# Patient Record
Sex: Female | Born: 1945 | Hispanic: Yes | Smoking: Never smoker
Health system: Southern US, Community
[De-identification: ages and names within clinical notes are randomized; demographics above are authoritative.]

## PROBLEM LIST (undated history)

## (undated) DIAGNOSIS — E785 Hyperlipidemia, unspecified: Secondary | ICD-10-CM

## (undated) DIAGNOSIS — I10 Essential (primary) hypertension: Secondary | ICD-10-CM

## (undated) DIAGNOSIS — R252 Cramp and spasm: Secondary | ICD-10-CM

## (undated) DIAGNOSIS — M1990 Unspecified osteoarthritis, unspecified site: Secondary | ICD-10-CM

## (undated) DIAGNOSIS — C801 Malignant (primary) neoplasm, unspecified: Secondary | ICD-10-CM

## (undated) HISTORY — PX: ABDOMINAL HYSTERECTOMY: SHX81

## (undated) HISTORY — DX: Cramp and spasm: R25.2

## (undated) HISTORY — DX: Essential (primary) hypertension: I10

## (undated) HISTORY — PX: APPENDECTOMY: SHX54

## (undated) HISTORY — DX: Malignant (primary) neoplasm, unspecified: C80.1

## (undated) HISTORY — DX: Hyperlipidemia, unspecified: E78.5

## (undated) HISTORY — PX: OTHER SURGICAL HISTORY: SHX169

---
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
## 1974-05-30 HISTORY — PX: TUBAL LIGATION: SHX77

## 2011-08-24 DIAGNOSIS — R350 Frequency of micturition: Secondary | ICD-10-CM | POA: Diagnosis not present

## 2011-08-24 DIAGNOSIS — J45909 Unspecified asthma, uncomplicated: Secondary | ICD-10-CM | POA: Diagnosis not present

## 2011-08-24 DIAGNOSIS — N390 Urinary tract infection, site not specified: Secondary | ICD-10-CM | POA: Diagnosis not present

## 2011-08-24 DIAGNOSIS — R05 Cough: Secondary | ICD-10-CM | POA: Diagnosis not present

## 2011-08-24 DIAGNOSIS — J309 Allergic rhinitis, unspecified: Secondary | ICD-10-CM | POA: Diagnosis not present

## 2011-10-21 DIAGNOSIS — N3941 Urge incontinence: Secondary | ICD-10-CM | POA: Diagnosis not present

## 2011-10-21 DIAGNOSIS — N390 Urinary tract infection, site not specified: Secondary | ICD-10-CM | POA: Diagnosis not present

## 2011-10-21 DIAGNOSIS — M25569 Pain in unspecified knee: Secondary | ICD-10-CM | POA: Diagnosis not present

## 2011-10-28 DIAGNOSIS — M25569 Pain in unspecified knee: Secondary | ICD-10-CM | POA: Diagnosis not present

## 2011-10-29 DIAGNOSIS — M25569 Pain in unspecified knee: Secondary | ICD-10-CM | POA: Diagnosis not present

## 2012-09-09 ENCOUNTER — Encounter (HOSPITAL_BASED_OUTPATIENT_CLINIC_OR_DEPARTMENT_OTHER): Payer: Self-pay | Admitting: *Deleted

## 2012-09-09 ENCOUNTER — Emergency Department (HOSPITAL_BASED_OUTPATIENT_CLINIC_OR_DEPARTMENT_OTHER)
Admission: EM | Admit: 2012-09-09 | Discharge: 2012-09-10 | Disposition: A | Discharge status: Home / Self Care | Payer: Medicare Other | Attending: Emergency Medicine | Admitting: Emergency Medicine

## 2012-09-09 DIAGNOSIS — R269 Unspecified abnormalities of gait and mobility: Secondary | ICD-10-CM | POA: Diagnosis not present

## 2012-09-09 DIAGNOSIS — IMO0002 Reserved for concepts with insufficient information to code with codable children: Secondary | ICD-10-CM | POA: Diagnosis not present

## 2012-09-09 DIAGNOSIS — Z79899 Other long term (current) drug therapy: Secondary | ICD-10-CM | POA: Diagnosis not present

## 2012-09-09 DIAGNOSIS — M25469 Effusion, unspecified knee: Secondary | ICD-10-CM | POA: Diagnosis not present

## 2012-09-09 DIAGNOSIS — M1710 Unilateral primary osteoarthritis, unspecified knee: Secondary | ICD-10-CM | POA: Diagnosis not present

## 2012-09-09 DIAGNOSIS — M1712 Unilateral primary osteoarthritis, left knee: Secondary | ICD-10-CM

## 2012-09-09 HISTORY — DX: Unspecified osteoarthritis, unspecified site: M19.90

## 2012-09-09 MED ORDER — OXYCODONE-ACETAMINOPHEN 5-325 MG PO TABS
1.0000 | ORAL_TABLET | Freq: Once | ORAL | Status: AC
  Administered 2012-09-10: 1 via ORAL
  Filled 2012-09-09 (×2): qty 1

## 2012-09-09 NOTE — ED Notes (Signed)
Family member states that pt was seen at Ortho 3 weeks ago and dx'd with arthritis. Knee was xrayed. Given Meloxicam and for the past few days it has not worked. Pain has increased.

## 2012-09-09 NOTE — ED Notes (Signed)
MD at bedside. 

## 2012-09-10 MED ORDER — OXYCODONE-ACETAMINOPHEN 5-325 MG PO TABS: 1.0000 | ORAL_TABLET | Freq: Four times a day (QID) | ORAL | Status: DC | PRN

## 2012-09-10 NOTE — ED Provider Notes (Signed)
History     CSN: [id number]  Arrival date & time 09/09/12  2136   First MD Initiated Contact with Patient 09/09/12 2352      Chief Complaint  Patient presents with  . Knee Pain    (Consider location/radiation/quality/duration/timing/severity/associated sxs/prior treatment) Patient is a 67 y.o. female presenting with knee pain.  Knee Pain Associated symptoms: no back pain and no fever    patient presents with left knee pain. His been seen by orthopedics for the same. Has been on Mobic. Had x-ray done at their office. Pain had improved but has worsened again. Worse with walking. No trauma. No fevers. Mild swelling. No chest pain or trouble breathing. She reportedly has arthritis in both knees. The orthopedic surgeon reportedly stated that she would inject some steroids if it does not get better.  Past Medical History  Diagnosis Date  . Arthritis     History reviewed. No pertinent past surgical history.  History reviewed. No pertinent family history.  History  Substance Use Topics  . Smoking status: Never Smoker   . Smokeless tobacco: Not on file  . Alcohol Use: No    OB History   Grav Para Term Preterm Abortions TAB SAB Ect Mult Living                  Review of Systems  Constitutional: Negative for fever.  Musculoskeletal: Positive for joint swelling and gait problem. Negative for back pain and arthralgias.  Skin: Negative for rash and wound.  Neurological: Negative for tremors, weakness and numbness.    Allergies  Review of patient's allergies indicates no known allergies.  Home Medications   Current Outpatient Rx  Name  Route  Sig  Dispense  Refill  . acetaminophen (TYLENOL) 500 MG tablet   Oral   Take 500 mg by mouth every 6 (six) hours as needed for pain.         . meloxicam (MOBIC) 15 MG tablet   Oral   Take 15 mg by mouth daily.         Marland Kitchen oxyCODONE-acetaminophen (PERCOCET/ROXICET) 5-325 MG per tablet   Oral   Take 1-2 tablets by mouth every 6  (six) hours as needed for pain.   15 tablet   0     BP 149/65  Pulse 68  Temp(Src) 98.2 F (36.8 C) (Oral)  Resp 20  Ht 5\' 5"  (1.651 m)  Wt 170 lb (77.111 kg)  BMI 28.29 kg/m2  SpO2 100%  Physical Exam  Constitutional: She appears well-developed and well-nourished.  HENT:  Head: Normocephalic.  Eyes: Pupils are equal, round, and reactive to light.  Musculoskeletal: She exhibits tenderness.  Tenderness to left knee, worse superiorly. Mild effusion. Joint is not irritable. Decreased range of motion due to pain. No erythema. Knee is stable. Neurovascular intact distally. Trace edema bilateral lower extremities  Skin: No erythema.    ED Course  Procedures (including critical care time)  Labs Reviewed - No data to display No results found.   1. Osteoarthritis of left knee       MDM  *Patient with left knee pain. Has seen Ortho for same. Has been on meloxicam. We'll add Percocet and will followup with her orthopedics. Does not appear infectious. No new injury and has had previous x-ray. Does not appear to need imaging at this time.doubt DVT        Juliet Rude. Rubin Payor, MD 09/10/12 [phone]

## 2013-01-01 DIAGNOSIS — R002 Palpitations: Secondary | ICD-10-CM | POA: Diagnosis not present

## 2013-07-17 ENCOUNTER — Emergency Department (HOSPITAL_BASED_OUTPATIENT_CLINIC_OR_DEPARTMENT_OTHER)
Admission: EM | Admit: 2013-07-17 | Discharge: 2013-07-17 | Disposition: A | Discharge status: Home / Self Care | Payer: Medicare Other | Attending: Emergency Medicine | Admitting: Emergency Medicine

## 2013-07-17 ENCOUNTER — Emergency Department (HOSPITAL_BASED_OUTPATIENT_CLINIC_OR_DEPARTMENT_OTHER): Payer: Medicare Other

## 2013-07-17 ENCOUNTER — Encounter (HOSPITAL_BASED_OUTPATIENT_CLINIC_OR_DEPARTMENT_OTHER): Payer: Self-pay | Admitting: Emergency Medicine

## 2013-07-17 DIAGNOSIS — IMO0002 Reserved for concepts with insufficient information to code with codable children: Secondary | ICD-10-CM | POA: Insufficient documentation

## 2013-07-17 DIAGNOSIS — S4980XA Other specified injuries of shoulder and upper arm, unspecified arm, initial encounter: Secondary | ICD-10-CM | POA: Diagnosis not present

## 2013-07-17 DIAGNOSIS — Z791 Long term (current) use of non-steroidal anti-inflammatories (NSAID): Secondary | ICD-10-CM | POA: Diagnosis not present

## 2013-07-17 DIAGNOSIS — M545 Low back pain, unspecified: Secondary | ICD-10-CM | POA: Diagnosis not present

## 2013-07-17 DIAGNOSIS — M129 Arthropathy, unspecified: Secondary | ICD-10-CM | POA: Insufficient documentation

## 2013-07-17 DIAGNOSIS — Y939 Activity, unspecified: Secondary | ICD-10-CM | POA: Insufficient documentation

## 2013-07-17 DIAGNOSIS — S298XXA Other specified injuries of thorax, initial encounter: Secondary | ICD-10-CM | POA: Diagnosis not present

## 2013-07-17 DIAGNOSIS — S46909A Unspecified injury of unspecified muscle, fascia and tendon at shoulder and upper arm level, unspecified arm, initial encounter: Secondary | ICD-10-CM | POA: Diagnosis not present

## 2013-07-17 DIAGNOSIS — Y9229 Other specified public building as the place of occurrence of the external cause: Secondary | ICD-10-CM | POA: Insufficient documentation

## 2013-07-17 DIAGNOSIS — W010XXA Fall on same level from slipping, tripping and stumbling without subsequent striking against object, initial encounter: Secondary | ICD-10-CM | POA: Insufficient documentation

## 2013-07-17 DIAGNOSIS — M25559 Pain in unspecified hip: Secondary | ICD-10-CM | POA: Diagnosis not present

## 2013-07-17 DIAGNOSIS — R079 Chest pain, unspecified: Secondary | ICD-10-CM | POA: Diagnosis not present

## 2013-07-17 DIAGNOSIS — M25519 Pain in unspecified shoulder: Secondary | ICD-10-CM | POA: Diagnosis not present

## 2013-07-17 DIAGNOSIS — M549 Dorsalgia, unspecified: Secondary | ICD-10-CM

## 2013-07-17 MED ORDER — HYDROCODONE-ACETAMINOPHEN 5-325 MG PO TABS
1.0000 | ORAL_TABLET | Freq: Once | ORAL | Status: AC
  Administered 2013-07-17: 1 via ORAL
  Filled 2013-07-17: qty 1

## 2013-07-17 MED ORDER — HYDROCODONE-ACETAMINOPHEN 5-325 MG PO TABS: 1.0000 | ORAL_TABLET | Freq: Four times a day (QID) | ORAL | Status: DC | PRN

## 2013-07-17 NOTE — ED Notes (Signed)
Pt c/o fall x 1 day ago landing on tile floor injuring right shoulder

## 2013-07-17 NOTE — ED Provider Notes (Signed)
CSN: [id number]     Arrival date & time 07/17/13  1450 History   First MD Initiated Contact with Patient 07/17/13 1711     Chief Complaint  Patient presents with  . Fall     (Consider location/radiation/quality/duration/timing/severity/associated sxs/prior Treatment) HPI  This is a 68 year old female with no significant past medical history who presents with right shoulder pain and back pain following a fall yesterday. Her son reports that she slipped and fell yesterday at [facility]. She has been ambulatory. She reports right shoulder pain that is worse with movement. She also reports back pain. She denies any radiation of pain down her legs. She denies hitting her head or losing consciousness. Pain is rated at 3/10. She's not taken anything for pain.  Past Medical History  Diagnosis Date  . Arthritis    History reviewed. No pertinent past surgical history. History reviewed. No pertinent family history. History  Substance Use Topics  . Smoking status: Never Smoker   . Smokeless tobacco: Not on file  . Alcohol Use: No   OB History   Grav Para Term Preterm Abortions TAB SAB Ect Mult Living                 Review of Systems  Musculoskeletal: Positive for back pain. Negative for neck pain and neck stiffness.       Right shoulder pain  Skin: Negative for wound.  Neurological: Negative for headaches.  All other systems reviewed and are negative.      Allergies  Review of patient's allergies indicates no known allergies.  Home Medications   Current Outpatient Rx  Name  Route  Sig  Dispense  Refill  . acetaminophen (TYLENOL) 500 MG tablet   Oral   Take 500 mg by mouth every 6 (six) hours as needed for pain.         Marland Kitchen HYDROcodone-acetaminophen (NORCO/VICODIN) 5-325 MG per tablet   Oral   Take 1 tablet by mouth every 6 (six) hours as needed for moderate pain.   15 tablet   0   . meloxicam (MOBIC) 15 MG tablet   Oral   Take 15 mg by mouth daily.         Marland Kitchen  oxyCODONE-acetaminophen (PERCOCET/ROXICET) 5-325 MG per tablet   Oral   Take 1-2 tablets by mouth every 6 (six) hours as needed for pain.   15 tablet   0    BP 132/54  Pulse 57  Temp(Src) 98.2 F (36.8 C) (Oral)  Resp 16  SpO2 98% Physical Exam  Nursing note and vitals reviewed. Constitutional: She is oriented to person, place, and time. She appears well-developed and well-nourished. No distress.  HENT:  Head: Normocephalic and atraumatic.  Neck:  No c spine tenderness  Cardiovascular: Normal rate, regular rhythm and normal heart sounds.   No murmur heard. Pulmonary/Chest: Effort normal and breath sounds normal. No respiratory distress. She has no wheezes.  Abdominal: Soft. There is no tenderness.  Musculoskeletal:  Pelvis stable, normal range of motion and strength of the bilateral upper extremities, tenderness to palpation over the right scapula, tenderness palpation over the lumbar spine without step off or deformity, negative straight leg raise, full range of motion of bilateral hips  Neurological: She is alert and oriented to person, place, and time.  Skin: Skin is warm and dry.  Psychiatric: She has a normal mood and affect.    ED Course  Procedures (including critical care time) Labs Review Labs Reviewed - No data to  display Imaging Review Dg Chest 2 View  07/17/2013   CLINICAL DATA:  Status post fall.  Chest pain.  EXAM: CHEST  2 VIEW  COMPARISON:  None.  FINDINGS: Lungs are clear. Heart size is normal. No pneumothorax or pleural effusion. No focal bony abnormality.  IMPRESSION: Negative chest.   Electronically Signed   By: Inge Rise M.D.   On: 07/17/2013 17:45   Dg Lumbar Spine Complete  07/17/2013   CLINICAL DATA:  Status post fall.  Low back pain.  EXAM: LUMBAR SPINE - COMPLETE 4+ VIEW  COMPARISON:  None.  FINDINGS: There is no fracture or malalignment. No notable degenerative change. No pars interarticularis defect. Visualized paraspinous structures are  unremarkable.  IMPRESSION: Negative exam.   Electronically Signed   By: Inge Rise M.D.   On: 07/17/2013 17:46   Dg Pelvis 1-2 Views  07/17/2013   CLINICAL DATA:  Status post fall.  Pain.  EXAM: PELVIS - 1-2 VIEW  COMPARISON:  None.  FINDINGS: Both hips are located. There is no fracture. No notable degenerative change is identified. Soft tissue structures are unremarkable.  IMPRESSION: Negative exam.   Electronically Signed   By: Inge Rise M.D.   On: 07/17/2013 17:45   Dg Shoulder Right  07/17/2013   CLINICAL DATA:  Right shoulder pain following a fall today.  EXAM: RIGHT SHOULDER - 2+ VIEW  COMPARISON:  None.  FINDINGS: There is no evidence of fracture or dislocation. There is no evidence of arthropathy or other focal bone abnormality. Soft tissues are unremarkable.  IMPRESSION: Normal examination.   Electronically Signed   By: Enrique Sack M.D.   On: 07/17/2013 15:39    EKG Interpretation   None       MDM   Final diagnoses:  Fall due to wet surface  Back pain    Patient presents following a fall yesterday. She is nontoxic-appearing. Musculoskeletal exam is without evidence of deformity. X-rays were obtained and are negative. Patient was given Vicodin with relief of her pain. Patient was on a short course of pain medication.  After history, exam, and medical workup I feel the patient has been appropriately medically screened and is safe for discharge home. Pertinent diagnoses were discussed with the patient. Patient was given return precautions.     Merryl Hacker, MD 07/19/13 [phone]

## 2013-07-17 NOTE — Discharge Instructions (Signed)
Dolor de espalda en el adulto  (Back Pain, Adult)   El dolor de cintura es frecuente. Aproximadamente 1 de cada 5 personas lo sufren. La causa rara vez pone en peligro la vida. Con frecuencia mejora luego de algún tiempo. Alrededor de la mitad de las personas que sufren un inicio súbito de dolor de cintura, se sentirán mejor luego de 2 semanas. Aproximadamente 8 de cada 10 se sentirán mejor luego de 6 semanas.   CAUSAS   Algunas causas comunes son:   · Distensión de los músculos o ligamentos que sostienen la columna vertebral.  · Desgaste (degeneración) de los discos vertebrales.  · Artritis.  · Traumatismos directos en la espalda.  DIAGNÓSTICO   La mayor parte de las veces, la causa directa no se conoce. Sin embargo, el dolor puede tratarse efectivamente aún cuando no se conozca la causa. Una de las formas más precisas de asegurar que la causa del dolor no constituye un peligro es responder a las preguntas del médico acerca de su salud y sus síntomas. Si el médico necesita más información, podrá indicar análisis de laboratorio o realizar un diagnóstico por imágenes (radiografías o resonancia magnética). Sin embargo, aunque las imágenes muestren modificaciones, generalmente no es necesaria la cirugía.   INSTRUCCIONES PARA EL CUIDADO EN EL HOGAR   En algunas personas, el dolor de espalda vuelve. Como rara vez es peligroso, los pacientes pueden aprender a manejarlo ellos mismos.   · Manténgase activo. Si permanece sentado o de pie mucho tiempo en el mismo lugar, se tensiona la espalda.  No se siente, maneje ni se quede parado en un mismo lugar por más de 30 minutos. Realice caminatas cortas en superficies planas ni bien el dolor haya cedido. Trate de aumentar cada día el tiempo que camina .  · No se quede en la cama. Si hace reposo durante más de 1 o 2 días, puede retrasar la recuperación.  · No evite los ejercicios ni el trabajo. El cuerpo está hecho para moverse. No es peligroso estar activo, aunque le duela la  espalda. La espalda se curará más rápido si continúa sus actividades antes de que el dolor se vaya.  · Preste atención a su cuerpo cuando se incline y se levante. Muchas personas sienten menos molestias cuando levantan objetos si doblan las rodillas, mantienen la carga cerca del cuerpo y evitan torcerse. Generalmente, las posiciones más cómodas son las que ejercen menos tensión en la espalda en recuperación.  · Encuentre una posición cómoda para dormir. Utilice un colchón firme y recuéstese de costado. Doble ligeramente sus rodillas. Si se recuesta sobre su espalda, coloque una almohada debajo de sus rodillas.  · Tome sólo medicamentos de venta libre o recetados, según las indicaciones del médico. Los medicamentos de venta libre para calmar el dolor y reducir la inflamación, son los que en general más ayudan. El médico podrá prescribirle relajantes musculares. Estos medicamentos calman el dolor de modo que pueda retornar a sus actividades normales y a realizar ejercicios saludables.  · Aplique hielo sobre la zona lesionada.  · Ponga el hielo en una bolsa plástica.  · Colóquese una toalla entre la piel y la bolsa de hielo.  · Deje la bolsa de hielo durante 15 a 20 minutos 3 a 4 veces por día, durante los primeros 2 ó 3 días. Luego podrá alternar entre calor y hielo para reducir el dolor y los espasmos.  · Consulte a su médico si puede tratar de hacer ejercicios para la espalda y recibir un masaje suave. Pueden ser beneficiosos.  · Evite sentirse ansioso o   estresado. El estrés aumenta la tensión muscular y puede empeorar el dolor de espalda. Es importante reconocer cuando está ansioso o estresado y aprender la forma de controlarlos. El ejercicio es una gran opción.  SOLICITE ATENCIÓN MÉDICA SI:   · Siente un dolor que no se alivia con reposo o medicamentos.  · El dolor no mejora en 1 semana.  · Desarrolla nuevos síntomas.  · No se siente bien en general.  SOLICITE ATENCIÓN MÉDICA DE INMEDIATO SI:  · Siente un dolor  que se irradia desde la espalda hacia sus piernas.  · Desarrolla nuevos problemas en el intestino o la vejiga.  · Siente debilidad o adormecimiento inusual en sus brazos o piernas.  · Presenta náuseas o vómitos.  · Presenta dolor abdominal.  · Se siente desfalleciente.  Document Released: 05/16/2005 Document Revised: 11/15/2011  ExitCare® Patient Information ©2014 [employer].

## 2013-11-13 ENCOUNTER — Ambulatory Visit (INDEPENDENT_AMBULATORY_CARE_PROVIDER_SITE_OTHER): Payer: Medicare Other | Admitting: Emergency Medicine

## 2013-11-13 VITALS — BP 130/70 | HR 72 | Temp 98.0°F | Resp 16 | Ht 64.0 in | Wt 191.0 lb

## 2013-11-13 DIAGNOSIS — IMO0002 Reserved for concepts with insufficient information to code with codable children: Secondary | ICD-10-CM | POA: Diagnosis not present

## 2013-11-13 DIAGNOSIS — M722 Plantar fascial fibromatosis: Secondary | ICD-10-CM

## 2013-11-13 DIAGNOSIS — M1710 Unilateral primary osteoarthritis, unspecified knee: Secondary | ICD-10-CM | POA: Diagnosis not present

## 2013-11-13 DIAGNOSIS — M1712 Unilateral primary osteoarthritis, left knee: Secondary | ICD-10-CM

## 2013-11-13 MED ORDER — NAPROXEN SODIUM 550 MG PO TABS: 550.0000 mg | ORAL_TABLET | Freq: Two times a day (BID) | ORAL | Status: AC

## 2013-11-13 NOTE — Patient Instructions (Signed)
Fascitis plantar  (Plantar Fascitis)  La fascitis plantar es un trastorno frecuente que ocasiona dolor en el pie. Se trata de una inflamación (irritación) de la banda de tejidos fibrosos y resistentes que se extienden desde el hueso del talón (calcáneo) hasta la parte anterior de la planta del pie. Esta inflamación puede deberse a que ha permanecido demasiado tiempo de pie, a zapatos que no calzan bien, a correr sobre superficies duras, al sobrepeso, a un andar anormal y el uso excesivo del pie con dolor (esto es frecuente en los corredores). También es frecuente entre los que practican ejercicios aeróbicos y entre los bailarines de ballet.  SÍNTOMAS  La persona que sufre fascitis plantar manifiesta:  · Dolor intenso por la mañana en la parte inferior del pie, especialmente al dar los primeros pasos luego de levantarse de la cama. Este dolor disminuye luego de caminar algunos minutos.  · Dolor intenso al caminar luego de un tiempo prolongado de inactividad.  · El dolor empeora al caminar descalzo o al subir escaleras.  DIAGNÓSTICO  · El médico hará el diagnóstico examinando sus pies.  · En general no es necesario indicar radiografías.  PREVENCIÓN  · Consulte a un profesional especializado en medicina del deporte antes de comenzar un nuevo programa de ejercicios.  · Los programas de caminatas ofrecen un buen entrenamiento. Hay una menor probabilidad de sufrir lesiones por uso excesivo, que son frecuentes entre las personas que corren. Hay menos impacto y menos agresión a las articulaciones.  · Comience lentamente todo nuevo programa de ejercicios. Si aparece algún problema o dolor, disminuya la cantidad de tiempo o la distancia hasta que se encuentre cómodo.  · Use calzado de buena calidad y reemplácelo regularmente.  · Estire el pie y los ligamentos que se encuentran en la parte posterior del tobillo (tendón de [place]) antes y después de realizar actividad física.  · Corra o practique ejercicios sobre superficies  parejas que no sean duras. Por ejemplo, el asfalto es mejor que el pavimento.  · No corra descalzo sobre superficies duras.  · Si camina sobre cinta, varíe la inclinación.  · No siga con el entrenamiento si tiene problemas en el pie o en la articulación. Busque ayuda profesional si no mejora.  INSTRUCCIONES PARA EL CUIDADO DOMICILIARIO  · Evite las actividades que le causan dolor hasta que se recupere.  · Use hielo o compresas frías sobre las zonas doloridas después de realizar ejercicios.  · Only take over-the-counter or prescription medicines for pain, discomfort, or fever as directed by your caregiver.  · Las plantillas blandas o las zapatillas con base de aire o gel pueden ser de gran ayuda.  · Si los problemas continúan o se agravan, consulte a un especialista en medicina del deporte o con su médico personal. La cortisona es un potente antiinflamatorio que puede inyectarse en la zona dolorida. El profesional que lo asiste comentará este tratamiento con usted.  ESTÉ SEGURO QUE:   · Comprende las instrucciones para el alta médica.  · Controlará su enfermedad.  · Solicitará atención médica de inmediato según las indicaciones.  Document Released: 02/23/2005 Document Revised: 08/08/2011  ExitCare® Patient Information ©2014 [employer].

## 2013-11-13 NOTE — Progress Notes (Signed)
[facility] [address], [place] [address] [phone]  Date:  11/13/2013   Name:  Brandi Moon   DOB:  [birth year]   MRN:  [id number]  PCP:  Jennye Moccasin, MD    Chief Complaint: Knee Pain   History of Present Illness:  Brandi Moon is a 68 y.o. very pleasant female patient who presents with the following:  No history of injury.  Told she has DJD in left knee at [facility].  Never referred to ortho per daughter.  Now has 3 month history of bilateral heel pain with no history of injury.  Pain is not severe when awakens but has immediate pain on standing and weight bearing.  No improvement with over the counter medications or other home remedies. Denies other complaint or health concern today.   There are no active problems to display for this patient.   Past Medical History  Diagnosis Date  . Arthritis     History reviewed. No pertinent past surgical history.  History  Substance Use Topics  . Smoking status: Never Smoker   . Smokeless tobacco: Not on file  . Alcohol Use: No    History reviewed. No pertinent family history.  No Known Allergies  Medication list has been reviewed and updated.  Current Outpatient Prescriptions on File Prior to Visit  Medication Sig Dispense Refill  . acetaminophen (TYLENOL) 500 MG tablet Take 500 mg by mouth every 6 (six) hours as needed for pain.      Marland Kitchen HYDROcodone-acetaminophen (NORCO/VICODIN) 5-325 MG per tablet Take 1 tablet by mouth every 6 (six) hours as needed for moderate pain.  15 tablet  0  . meloxicam (MOBIC) 15 MG tablet Take 15 mg by mouth daily.      Marland Kitchen oxyCODONE-acetaminophen (PERCOCET/ROXICET) 5-325 MG per tablet Take 1-2 tablets by mouth every 6 (six) hours as needed for pain.  15 tablet  0   No current facility-administered medications on file prior to visit.    Review of Systems:  As per HPI, otherwise negative.    Physical Examination: Filed Vitals:   11/13/13 1410   BP: 130/70  Pulse: 72  Temp: 98 F (36.7 C)  Resp: 16   Filed Vitals:   11/13/13 1410  Height: 5\' 4"  (1.626 m)  Weight: 191 lb (86.637 kg)   Body mass index is 32.77 kg/(m^2). Ideal Body Weight: Weight in (lb) to have BMI = 25: 145.3   GEN: WDWN, NAD, Non-toxic, Alert & Oriented x 3 HEENT: Atraumatic, Normocephalic.  Ears and Nose: No external deformity. EXTR: No clubbing/cyanosis/edema NEURO: Normal gait.  PSYCH: Normally interactive. Conversant. Not depressed or anxious appearing.  Calm demeanor.  Left Knee:  No tenderness. Full ROM.  No effusion Left and Right heel:  Tender to palpation on plantar surface and with dorsiflexion  Assessment and Plan: OA knee Plantar fasciitis Podiatry and ortho referrals Anaprox   Signed,  Ellison Carwin, MD

## 2013-11-28 ENCOUNTER — Ambulatory Visit (INDEPENDENT_AMBULATORY_CARE_PROVIDER_SITE_OTHER): Payer: Medicare Other | Admitting: Podiatry

## 2013-11-28 ENCOUNTER — Encounter: Payer: Self-pay | Admitting: Podiatry

## 2013-11-28 ENCOUNTER — Ambulatory Visit (INDEPENDENT_AMBULATORY_CARE_PROVIDER_SITE_OTHER): Payer: Medicare Other

## 2013-11-28 VITALS — BP 133/69 | HR 77 | Resp 16

## 2013-11-28 DIAGNOSIS — M722 Plantar fascial fibromatosis: Secondary | ICD-10-CM | POA: Diagnosis not present

## 2013-11-28 MED ORDER — TRIAMCINOLONE ACETONIDE 10 MG/ML IJ SUSP
10.0000 mg | Freq: Once | INTRAMUSCULAR | Status: AC
  Administered 2013-11-28: 10 mg

## 2013-11-28 NOTE — Progress Notes (Signed)
   Subjective:    Patient ID: Brandi Moon, female    DOB: 1946/02/19, 68 y.o.   MRN: [id number]  HPI Comments: "She has pain in her feet"  Patient presents with daughter today-patient does not speak english.   She states that she has aching plantar heels bilateral, left over right, for about 1 month. She does AM pain and after sitting for long periods. Better with walking around. This is also causing her knees to hurt. She saw PCP gave Naprosyn and she has been trying ice and massages at home. Not better.  Foot Pain Associated symptoms include arthralgias.      Review of Systems  Cardiovascular: Positive for leg swelling.       Calf pain with walking  Musculoskeletal: Positive for arthralgias and gait problem.  All other systems reviewed and are negative.      Objective:   Physical Exam        Assessment & Plan:

## 2013-11-28 NOTE — Patient Instructions (Signed)

## 2013-11-30 NOTE — Progress Notes (Signed)
Subjective:     Patient ID: Brandi Moon, female   DOB: 02/02/1946, 68 y.o.   MRN: [id number]  Foot Pain   patient states that she's been having pain in her heels the left being worse than the right and that this is been going on for several months and worse in the last month. She has tried Naprosyn and using ice and massage is   Review of Systems  All other systems reviewed and are negative.      Objective:   Physical Exam  Nursing note and vitals reviewed. Constitutional: She is oriented to person, place, and time.  Cardiovascular: Intact distal pulses.   Musculoskeletal: Normal range of motion.  Neurological: She is oriented to person, place, and time.  Skin: Skin is warm.   neurovascular status is intact with muscle strength adequate of the subtalar and midtarsal joint. I noted range of motion also to be within normal limits and patient is found to have good digital perfusion. Arch height is diminished upon weightbearing and I noted upon palpation significant discomfort at the insertion of the plantar fascia into the calcaneus left over right     Assessment:     Plantar fasciitis left over right with biomechanical condition    Plan:     H&P and x-rays performed. Today I injected the plantar fascia of both heels 3 mg Kenalog 5 mg Xylocaine Marcaine mixture and I dispense fascial brace for the left with instructions and explained physical therapy. Reappoint to recheck

## 2013-12-02 DIAGNOSIS — M1710 Unilateral primary osteoarthritis, unspecified knee: Secondary | ICD-10-CM | POA: Diagnosis not present

## 2014-08-07 ENCOUNTER — Ambulatory Visit (INDEPENDENT_AMBULATORY_CARE_PROVIDER_SITE_OTHER): Payer: Medicare Other

## 2014-08-07 ENCOUNTER — Ambulatory Visit (INDEPENDENT_AMBULATORY_CARE_PROVIDER_SITE_OTHER): Payer: Medicare Other | Admitting: Family Medicine

## 2014-08-07 VITALS — BP 112/62 | HR 60 | Temp 97.5°F | Resp 16 | Ht 64.0 in | Wt 191.0 lb

## 2014-08-07 DIAGNOSIS — R05 Cough: Secondary | ICD-10-CM | POA: Diagnosis not present

## 2014-08-07 DIAGNOSIS — R112 Nausea with vomiting, unspecified: Secondary | ICD-10-CM

## 2014-08-07 DIAGNOSIS — R42 Dizziness and giddiness: Secondary | ICD-10-CM | POA: Diagnosis not present

## 2014-08-07 DIAGNOSIS — R059 Cough, unspecified: Secondary | ICD-10-CM

## 2014-08-07 DIAGNOSIS — E869 Volume depletion, unspecified: Secondary | ICD-10-CM

## 2014-08-07 DIAGNOSIS — R197 Diarrhea, unspecified: Secondary | ICD-10-CM | POA: Diagnosis not present

## 2014-08-07 LAB — COMPREHENSIVE METABOLIC PANEL
ALBUMIN: 3.6 g/dL (ref 3.5–5.2)
ALT: 18 U/L (ref 0–35)
AST: 22 U/L (ref 0–37)
Alkaline Phosphatase: 90 U/L (ref 39–117)
BILIRUBIN TOTAL: 0.4 mg/dL (ref 0.2–1.2)
BUN: 11 mg/dL (ref 6–23)
CALCIUM: 8.5 mg/dL (ref 8.4–10.5)
CO2: 19 meq/L (ref 19–32)
CREATININE: 0.7 mg/dL (ref 0.50–1.10)
Chloride: 106 mEq/L (ref 96–112)
GLUCOSE: 82 mg/dL (ref 70–99)
POTASSIUM: 4.5 meq/L (ref 3.5–5.3)
Sodium: 140 mEq/L (ref 135–145)
Total Protein: 6.9 g/dL (ref 6.0–8.3)

## 2014-08-07 LAB — POCT CBC
GRANULOCYTE PERCENT: 51.2 % (ref 37–80)
HCT, POC: 42 % (ref 37.7–47.9)
HEMOGLOBIN: 12.9 g/dL (ref 12.2–16.2)
Lymph, poc: 3.1 (ref 0.6–3.4)
MCH, POC: 25.9 pg — AB (ref 27–31.2)
MCHC: 30.8 g/dL — AB (ref 31.8–35.4)
MCV: 84 fL (ref 80–97)
MID (cbc): 0.3 (ref 0–0.9)
MPV: 8.7 fL (ref 0–99.8)
POC GRANULOCYTE: 3.6 (ref 2–6.9)
POC LYMPH %: 44.3 % (ref 10–50)
POC MID %: 4.5 %M (ref 0–12)
Platelet Count, POC: 268 10*3/uL (ref 142–424)
RBC: 5 M/uL (ref 4.04–5.48)
RDW, POC: 15 %
WBC: 7 10*3/uL (ref 4.6–10.2)

## 2014-08-07 LAB — LIPASE: Lipase: 58 U/L (ref 0–75)

## 2014-08-07 MED ORDER — ONDANSETRON 4 MG PO TBDP
4.0000 mg | ORAL_TABLET | Freq: Once | ORAL | Status: AC
  Administered 2014-08-07: 4 mg via ORAL

## 2014-08-07 MED ORDER — ONDANSETRON 4 MG PO TBDP: 4.0000 mg | ORAL_TABLET | Freq: Three times a day (TID) | ORAL | Status: DC | PRN

## 2014-08-07 NOTE — Progress Notes (Signed)
Subjective:    Patient ID: Brandi Moon, female    DOB: 03-09-1946, 69 y.o.   MRN: [id number] This chart was scribed for Brandi Ray, MD by [name], Medical Scribe. This patient was seen in Room 3 and the patient's care was started at 1:40 PM.    HPI HPI Comments: Brandi Moon is a 69 y.o. female with a hx of uterine cancer who presents to the [facility] for gradual onset, worsening URI symptoms that started 1 week ago. Patient reports having cough, congestion, chills, diaphoresis, and right ear pain. As of yesterday, patient had nausea, 7 episodes of watery diarrhea, 2 episodes of vomiting last night and dizziness when trying to stand as of last night. Patient states that her dizziness is the worst of her symptoms. She did not eat anything yesterday. She notes that she has urinated 4 times today. Patient notes diaphoresis especially after taking ibuprofen. She has taken OTC Robitussin for her symptoms, but she has not taken anything for nausea or vomiting. Patient denies SOB, abdominal pain, dysuria, and difficulty urinating. She also denies EtOH use. She has not had her flu shot or pneumonia shot. Patient does have hx of pneumonia in the past. She states she does not take any regular medications.   Patient speaks Spanish, and an interpreter was in the room.  There are no active problems to display for this patient.  Past Medical History  Diagnosis Date  . Arthritis   . Cancer     uterine   No past surgical history on file. No Known Allergies Prior to Admission medications   Medication Sig Start Date End Date Taking? Authorizing Provider  guaiFENesin (ROBITUSSIN) 100 MG/5ML liquid Take 200 mg by mouth 3 (three) times daily as needed for cough.   Yes Historical Provider, MD  naproxen sodium (ANAPROX DS) 550 MG tablet Take 1 tablet (550 mg total) by mouth 2 (two) times daily with a meal. Patient not taking: Reported on 08/07/2014 11/13/13 11/13/14   Roselee Culver, MD   History   Social History  . Marital Status: Widowed    Spouse Name: N/A  . Number of Children: N/A  . Years of Education: N/A   Occupational History  . Not on file.   Social History Main Topics  . Smoking status: Never Smoker   . Smokeless tobacco: Not on file  . Alcohol Use: No  . Drug Use: No  . Sexual Activity: Not on file   Other Topics Concern  . Not on file   Social History Narrative     Review of Systems  Constitutional: Positive for chills and diaphoresis.  HENT: Positive for congestion and ear pain.   Respiratory: Positive for cough. Negative for shortness of breath.   Gastrointestinal: Positive for nausea, vomiting and diarrhea. Negative for abdominal pain.  Genitourinary: Negative for dysuria and difficulty urinating.  Neurological: Positive for dizziness.       Objective:   Physical Exam  Constitutional: She is oriented to person, place, and time. She appears well-developed and well-nourished. No distress.  HENT:  Head: Normocephalic and atraumatic.  Right Ear: Hearing, tympanic membrane, external ear and ear canal normal.  Left Ear: Hearing, tympanic membrane, external ear and ear canal normal.  Nose: Nose normal.  Mouth/Throat: Oropharynx is clear and moist. No oropharyngeal exudate.  TMs pearly grey bilaterally. No effusion seen. Slightly tacky oral mucosa.  Eyes: Conjunctivae and EOM are normal. Pupils are equal, round, and reactive  to light. Right eye exhibits no nystagmus. Left eye exhibits no nystagmus.  Cardiovascular: Normal rate, regular rhythm, normal heart sounds and intact distal pulses.  Exam reveals no gallop and no friction rub.   No murmur heard. Pulmonary/Chest: Effort normal and breath sounds normal. No respiratory distress. She has no wheezes. She has no rhonchi. She has no rales.  Abdominal: Bowel sounds are increased.  Hyperactive bowel sounds.  Neurological: She is alert and oriented to person, place, and  time.  Skin: Skin is warm and dry. No rash noted.  Psychiatric: She has a normal mood and affect. Her behavior is normal.  Vitals reviewed.  Filed Vitals:   08/07/14 1238  BP: 112/62  Pulse: 60  Temp: 97.5 F (36.4 C)  TempSrc: Oral  Resp: 16  Height: 5\' 4"  (1.626 m)  Weight: 191 lb (86.637 kg)  SpO2: 97%   Results for orders placed or performed in visit on 08/07/14  POCT CBC  Result Value Ref Range   WBC 7.0 4.6 - 10.2 K/uL   Lymph, poc 3.1 0.6 - 3.4   POC LYMPH PERCENT 44.3 10 - 50 %L   MID (cbc) 0.3 0 - 0.9   POC MID % 4.5 0 - 12 %M   POC Granulocyte 3.6 2 - 6.9   Granulocyte percent 51.2 37 - 80 %G   RBC 5.00 4.04 - 5.48 M/uL   Hemoglobin 12.9 12.2 - 16.2 g/dL   HCT, POC 42.0 37.7 - 47.9 %   MCV 84.0 80 - 97 fL   MCH, POC 25.9 (A) 27 - 31.2 pg   MCHC 30.8 (A) 31.8 - 35.4 g/dL   RDW, POC 15.0 %   Platelet Count, POC 268 142 - 424 K/uL   MPV 8.7 0 - 99.8 fL   [facility] (PRIMARY) x-Moon report read by Dr. Carlota Raspberry:  CXR - slight elevation of right hemidiaphragm. No acute findings. No apparent infiltrates.     Assessment & Plan:   TELITHA PLATH is a 69 y.o. female Cough - Plan: POCT CBC, Comprehensive metabolic panel, DG Chest 2 View  - improving. CXR appears ok and CBC reassuring. Resolving viral illness including influenza possible. Sx care and rtc precautions.   Volume depletion/Dizziness -Diarrhea, Nausea and vomiting, vomiting of unspecified type - Plan: ondansetron (ZOFRAN-ODT) disintegrating tablet 4 mg, ondansetron (ZOFRAN ODT) 4 MG disintegrating tablet  Suspected viral illness.  Reassuring abd exam and CBC. Check CMP.  Dizziness resolved with IVF and volume repletion. N/v resolved with ZOfran x 1 in office.   -zofran 4mg  Q8h prn.   -oral rehydration therapy discussed.   -RTC/ER precautions if worsening. Discussed with dtr who also interpreted when I did not speak spanish. Understanding expressed.   Meds ordered this encounter  Medications  .       .  ondansetron (ZOFRAN-ODT) disintegrating tablet 4 mg    Sig:   . ondansetron (ZOFRAN ODT) 4 MG disintegrating tablet    Sig: Take 1 tablet (4 mg total) by mouth every 8 (eight) hours as needed for nausea or vomiting.    Dispense:  10 tablet    Refill:  0   Patient Instructions  zofran if needed for nausea, small sips of fluids frequently. If vomting or diarrhea not improving in next few days, or worsening of other symptoms sooner - return here or emergency room.  Return to the clinic or go to the nearest emergency room if any of your symptoms worsen or new symptoms occur.  Dizziness Dizziness is a common problem. It is a feeling of unsteadiness or light-headedness. You may feel like you are about to faint. Dizziness can lead to injury if you stumble or fall. A person of any age group can suffer from dizziness, but dizziness is more common in older adults. CAUSES  Dizziness can be caused by many different things, including:  Middle ear problems.  Standing for too long.  Infections.  An allergic reaction.  Aging.  An emotional response to something, such as the sight of blood.  Side effects of medicines.  Tiredness.  Problems with circulation or blood pressure.  Excessive use of alcohol or medicines, or illegal drug use.  Breathing too fast (hyperventilation).  An irregular heart rhythm (arrhythmia).  A low red blood cell count (anemia).  Pregnancy.  Vomiting, diarrhea, fever, or other illnesses that cause body fluid loss (dehydration).  Diseases or conditions such as Parkinson's disease, high blood pressure (hypertension), diabetes, and thyroid problems.  Exposure to extreme heat. DIAGNOSIS  Your health care provider will ask about your symptoms, perform a physical exam, and perform an electrocardiogram (ECG) to record the electrical activity of your heart. Your health care provider may also perform other heart or blood tests to determine the cause of your dizziness.  These may include:  Transthoracic echocardiogram (TTE). During echocardiography, sound waves are used to evaluate how blood flows through your heart.  Transesophageal echocardiogram (TEE).  Cardiac monitoring. This allows your health care provider to monitor your heart rate and rhythm in real time.  Holter monitor. This is a portable device that records your heartbeat and can help diagnose heart arrhythmias. It allows your health care provider to track your heart activity for several days if needed.  Stress tests by exercise or by giving medicine that makes the heart beat faster. TREATMENT  Treatment of dizziness depends on the cause of your symptoms and can vary greatly. HOME CARE INSTRUCTIONS   Drink enough fluids to keep your urine clear or pale yellow. This is especially important in very hot weather. In older adults, it is also important in cold weather.  Take your medicine exactly as directed if your dizziness is caused by medicines. When taking blood pressure medicines, it is especially important to get up slowly. 1. Rise slowly from chairs and steady yourself until you feel okay. 2. In the morning, first sit up on the side of the bed. When you feel okay, stand slowly while holding onto something until you know your balance is fine.  Move your legs often if you need to stand in one place for a long time. Tighten and relax your muscles in your legs while standing.  Have someone stay with you for 1-2 days if dizziness continues to be a problem. Do this until you feel you are well enough to stay alone. Have the person call your health care provider if he or she notices changes in you that are concerning.  Do not drive or use heavy machinery if you feel dizzy.  Do not drink alcohol. SEEK IMMEDIATE MEDICAL CARE IF:   Your dizziness or light-headedness gets worse.  You feel nauseous or vomit.  You have problems talking, walking, or using your arms, hands, or legs.  You feel  weak.  You are not thinking clearly or you have trouble forming sentences. It may take a friend or family member to notice this.  You have chest pain, abdominal pain, shortness of breath, or sweating.  Your vision changes.  You  notice any bleeding.  You have side effects from medicine that seems to be getting worse rather than better. MAKE SURE YOU:   Understand these instructions.  Will watch your condition.  Will get help right away if you are not doing well or get worse. Document Released: 11/09/2000 Document Revised: 05/21/2013 Document Reviewed: 12/03/2010 [facility] Patient Information 2015 [place], [place]. This information is not intended to replace advice given to you by your health care provider. Make sure you discuss any questions you have with your health care provider.   Dehydration, Adult Dehydration is when you lose more fluids from the body than you take in. Vital organs like the kidneys, brain, and heart cannot function without a proper amount of fluids and salt. Any loss of fluids from the body can cause dehydration.  CAUSES   Vomiting.  Diarrhea.  Excessive sweating.  Excessive urine output.  Fever. SYMPTOMS  Mild dehydration  Thirst.  Dry lips.  Slightly dry mouth. Moderate dehydration  Very dry mouth.  Sunken eyes.  Skin does not bounce back quickly when lightly pinched and released.  Dark urine and decreased urine production.  Decreased tear production.  Headache. Severe dehydration  Very dry mouth.  Extreme thirst.  Rapid, weak pulse (more than 100 beats per minute at rest).  Cold hands and feet.  Not able to sweat in spite of heat and temperature.  Rapid breathing.  Blue lips.  Confusion and lethargy.  Difficulty being awakened.  Minimal urine production.  No tears. DIAGNOSIS  Your caregiver will diagnose dehydration based on your symptoms and your exam. Blood and urine tests will help confirm the diagnosis. The  diagnostic evaluation should also identify the cause of dehydration. TREATMENT  Treatment of mild or moderate dehydration can often be done at home by increasing the amount of fluids that you drink. It is best to drink small amounts of fluid more often. Drinking too much at one time can make vomiting worse. Refer to the home care instructions below. Severe dehydration needs to be treated at the hospital where you will probably be given intravenous (IV) fluids that contain water and electrolytes. HOME CARE INSTRUCTIONS   Ask your caregiver about specific rehydration instructions.  Drink enough fluids to keep your urine clear or pale yellow.  Drink small amounts frequently if you have nausea and vomiting.  Eat as you normally do.  Avoid: 1. Foods or drinks high in sugar. 2. Carbonated drinks. 3. Juice. 4. Extremely hot or cold fluids. 5. Drinks with caffeine. 6. Fatty, greasy foods. 7. Alcohol. 8. Tobacco. 9. Overeating. 10. Gelatin desserts.  Wash your hands well to avoid spreading bacteria and viruses.  Only take over-the-counter or prescription medicines for pain, discomfort, or fever as directed by your caregiver.  Ask your caregiver if you should continue all prescribed and over-the-counter medicines.  Keep all follow-up appointments with your caregiver. SEEK MEDICAL CARE IF:  You have abdominal pain and it increases or stays in one area (localizes).  You have a rash, stiff neck, or severe headache.  You are irritable, sleepy, or difficult to awaken.  You are weak, dizzy, or extremely thirsty. SEEK IMMEDIATE MEDICAL CARE IF:   You are unable to keep fluids down or you get worse despite treatment.  You have frequent episodes of vomiting or diarrhea.  You have blood or green matter (bile) in your vomit.  You have blood in your stool or your stool looks black and tarry.  You have not urinated in 6 to  8 hours, or you have only urinated a small amount of very dark  urine.  You have a fever.  You faint. MAKE SURE YOU:   Understand these instructions.  Will watch your condition.  Will get help right away if you are not doing well or get worse. Document Released: 05/16/2005 Document Revised: 08/08/2011 Document Reviewed: 01/03/2011 [facility] Patient Information 2015 [place], [place]. This information is not intended to replace advice given to you by your health care provider. Make sure you discuss any questions you have with your health care provider.  Gastroenteritis:  Diarrhea Infections caused by germs (bacterial) or a virus commonly cause diarrhea. Your caregiver has determined that with time, rest and fluids, the diarrhea should improve. In general, eat normally while drinking more water than usual. Although water may prevent dehydration, it does not contain salt and minerals (electrolytes). Broths, weak tea without caffeine and oral rehydration solutions (ORS) replace fluids and electrolytes. Small amounts of fluids should be taken frequently. Large amounts at one time may not be tolerated. Plain water may be harmful in infants and the elderly. Oral rehydrating solutions (ORS) are available at pharmacies and grocery stores. ORS replace water and important electrolytes in proper proportions. Sports drinks are not as effective as ORS and may be harmful due to sugars worsening diarrhea.  ORS is especially recommended for use in children with diarrhea. As a general guideline for children, replace any new fluid losses from diarrhea and/or vomiting with ORS as follows:   If your child weighs 22 pounds or under (10 kg or less), give 60-120 mL ( -  cup or 2 - 4 ounces) of ORS for each episode of diarrheal stool or vomiting episode.   If your child weighs more than 22 pounds (more than 10 kgs), give 120-240 mL ( - 1 cup or 4 - 8 ounces) of ORS for each diarrheal stool or episode of vomiting.   While correcting for dehydration, children should eat  normally. However, foods high in sugar should be avoided because this may worsen diarrhea. Large amounts of carbonated soft drinks, juice, gelatin desserts and other highly sugared drinks should be avoided.   After correction of dehydration, other liquids that are appealing to the child may be added. Children should drink small amounts of fluids frequently and fluids should be increased as tolerated. Children should drink enough fluids to keep urine clear or pale yellow.   Adults should eat normally while drinking more fluids than usual. Drink small amounts of fluids frequently and increase as tolerated. Drink enough fluids to keep urine clear or pale yellow. Broths, weak decaffeinated tea, lemon lime soft drinks (allowed to go flat) and ORS replace fluids and electrolytes.   Avoid:   Carbonated drinks.   Juice.   Extremely hot or cold fluids.   Caffeine drinks.   Fatty, greasy foods.   Alcohol.   Tobacco.   Too much intake of anything at one time.   Gelatin desserts.   Probiotics are active cultures of beneficial bacteria. They may lessen the amount and number of diarrheal stools in adults. Probiotics can be found in yogurt with active cultures and in supplements.   Wash hands well to avoid spreading bacteria and virus.   Anti-diarrheal medications are not recommended for infants and children.   Only take over-the-counter or prescription medicines for pain, discomfort or fever as directed by your caregiver. Do not give aspirin to children because it may cause Reye's Syndrome.   For adults, ask your  caregiver if you should continue all prescribed and over-the-counter medicines.   If your caregiver has given you a follow-up appointment, it is very important to keep that appointment. Not keeping the appointment could result in a chronic or permanent injury, and disability. If there is any problem keeping the appointment, you must call back to this facility for assistance.  SEEK  IMMEDIATE MEDICAL CARE IF:   You or your child is unable to keep fluids down or other symptoms or problems become worse in spite of treatment.   Vomiting or diarrhea develops and becomes persistent.   There is vomiting of blood or bile (green material).   There is blood in the stool or the stools are black and tarry.   There is no urine output in 6-8 hours or there is only a small amount of very dark urine.   Abdominal pain develops, increases or localizes.   You have a fever.   Your baby is older than 3 months with a rectal temperature of 102 F (38.9 C) or higher.   Your baby is 91 months old or younger with a rectal temperature of 100.4 F (38 C) or higher.   You or your child develops excessive weakness, dizziness, fainting or extreme thirst.   You or your child develops a rash, stiff neck, severe headache or become irritable or sleepy and difficult to awaken.  MAKE SURE YOU:   Understand these instructions.   Will watch your condition.   Will get help right away if you are not doing well or get worse.  Document Released: 05/06/2002 Document Revised: 05/05/2011 Document Reviewed: 03/23/2009 [facility] Patient Information 2012 [place].  Nausea and Vomiting Nausea is a sick feeling that often comes before throwing up (vomiting). Vomiting is a reflex where stomach contents come out of your mouth. Vomiting can cause severe loss of body fluids (dehydration). Children and elderly adults can become dehydrated quickly, especially if they also have diarrhea. Nausea and vomiting are symptoms of a condition or disease. It is important to find the cause of your symptoms. CAUSES   Direct irritation of the stomach lining. This irritation can result from increased acid production (gastroesophageal reflux disease), infection, food poisoning, taking certain medicines (such as nonsteroidal anti-inflammatory drugs), alcohol use, or tobacco use.   Signals from the brain.These signals  could be caused by a headache, heat exposure, an inner ear disturbance, increased pressure in the brain from injury, infection, a tumor, or a concussion, pain, emotional stimulus, or metabolic problems.   An obstruction in the gastrointestinal tract (bowel obstruction).   Illnesses such as diabetes, hepatitis, gallbladder problems, appendicitis, kidney problems, cancer, sepsis, atypical symptoms of a heart attack, or eating disorders.   Medical treatments such as chemotherapy and radiation.   Receiving medicine that makes you sleep (general anesthetic) during surgery.  DIAGNOSIS Your caregiver may ask for tests to be done if the problems do not improve after a few days. Tests may also be done if symptoms are severe or if the reason for the nausea and vomiting is not clear. Tests may include:  Urine tests.   Blood tests.   Stool tests.   Cultures (to look for evidence of infection).   X-rays or other imaging studies.  Test results can help your caregiver make decisions about treatment or the need for additional tests. TREATMENT You need to stay well hydrated. Drink frequently but in small amounts.You may wish to drink water, sports drinks, clear broth, or eat frozen ice pops  or gelatin dessert to help stay hydrated.When you eat, eating slowly may help prevent nausea.There are also some antinausea medicines that may help prevent nausea. HOME CARE INSTRUCTIONS   Take all medicine as directed by your caregiver.   If you do not have an appetite, do not force yourself to eat. However, you must continue to drink fluids.   If you have an appetite, eat a normal diet unless your caregiver tells you differently.   Eat a variety of complex carbohydrates (rice, wheat, potatoes, bread), lean meats, yogurt, fruits, and vegetables.   Avoid high-fat foods because they are more difficult to digest.   Drink enough water and fluids to keep your urine clear or pale yellow.   If you are  dehydrated, ask your caregiver for specific rehydration instructions. Signs of dehydration may include:   Severe thirst.   Dry lips and mouth.   Dizziness.   Dark urine.   Decreasing urine frequency and amount.   Confusion.   Rapid breathing or pulse.  SEEK IMMEDIATE MEDICAL CARE IF:   You have blood or brown flecks (like coffee grounds) in your vomit.   You have black or bloody stools.   You have a severe headache or stiff neck.   You are confused.   You have severe abdominal pain.   You have chest pain or trouble breathing.   You do not urinate at least once every 8 hours.   You develop cold or clammy skin.   You continue to vomit for longer than 24 to 48 hours.   You have a fever.  MAKE SURE YOU:   Understand these instructions.   Will watch your condition.   Will get help right away if you are not doing well or get worse.  Document Released: 05/16/2005 Document Revised: 05/05/2011 Document Reviewed: 10/13/2010 [facility] Patient Information 2012 [place], [place].  Return to the clinic or go to the nearest emergency room if any of your symptoms worsen or new symptoms occur.     I personally performed the services described in this documentation, which was scribed in my presence. The recorded information has been reviewed and considered, and addended by me as needed.

## 2014-08-07 NOTE — Patient Instructions (Signed)
zofran if needed for nausea, small sips of fluids frequently. If vomting or diarrhea not improving in next few days, or worsening of other symptoms sooner - return here or emergency room.  Return to the clinic or go to the nearest emergency room if any of your symptoms worsen or new symptoms occur.  Dizziness Dizziness is a common problem. It is a feeling of unsteadiness or light-headedness. You may feel like you are about to faint. Dizziness can lead to injury if you stumble or fall. A person of any age group can suffer from dizziness, but dizziness is more common in older adults. CAUSES  Dizziness can be caused by many different things, including:  Middle ear problems.  Standing for too long.  Infections.  An allergic reaction.  Aging.  An emotional response to something, such as the sight of blood.  Side effects of medicines.  Tiredness.  Problems with circulation or blood pressure.  Excessive use of alcohol or medicines, or illegal drug use.  Breathing too fast (hyperventilation).  An irregular heart rhythm (arrhythmia).  A low red blood cell count (anemia).  Pregnancy.  Vomiting, diarrhea, fever, or other illnesses that cause body fluid loss (dehydration).  Diseases or conditions such as Parkinson's disease, high blood pressure (hypertension), diabetes, and thyroid problems.  Exposure to extreme heat. DIAGNOSIS  Your health care provider will ask about your symptoms, perform a physical exam, and perform an electrocardiogram (ECG) to record the electrical activity of your heart. Your health care provider may also perform other heart or blood tests to determine the cause of your dizziness. These may include:  Transthoracic echocardiogram (TTE). During echocardiography, sound waves are used to evaluate how blood flows through your heart.  Transesophageal echocardiogram (TEE).  Cardiac monitoring. This allows your health care provider to monitor your heart rate and  rhythm in real time.  Holter monitor. This is a portable device that records your heartbeat and can help diagnose heart arrhythmias. It allows your health care provider to track your heart activity for several days if needed.  Stress tests by exercise or by giving medicine that makes the heart beat faster. TREATMENT  Treatment of dizziness depends on the cause of your symptoms and can vary greatly. HOME CARE INSTRUCTIONS   Drink enough fluids to keep your urine clear or pale yellow. This is especially important in very hot weather. In older adults, it is also important in cold weather.  Take your medicine exactly as directed if your dizziness is caused by medicines. When taking blood pressure medicines, it is especially important to get up slowly. 1. Rise slowly from chairs and steady yourself until you feel okay. 2. In the morning, first sit up on the side of the bed. When you feel okay, stand slowly while holding onto something until you know your balance is fine.  Move your legs often if you need to stand in one place for a long time. Tighten and relax your muscles in your legs while standing.  Have someone stay with you for 1-2 days if dizziness continues to be a problem. Do this until you feel you are well enough to stay alone. Have the person call your health care provider if he or she notices changes in you that are concerning.  Do not drive or use heavy machinery if you feel dizzy.  Do not drink alcohol. SEEK IMMEDIATE MEDICAL CARE IF:   Your dizziness or light-headedness gets worse.  You feel nauseous or vomit.  You have problems  talking, walking, or using your arms, hands, or legs.  You feel weak.  You are not thinking clearly or you have trouble forming sentences. It may take a friend or family member to notice this.  You have chest pain, abdominal pain, shortness of breath, or sweating.  Your vision changes.  You notice any bleeding.  You have side effects from  medicine that seems to be getting worse rather than better. MAKE SURE YOU:   Understand these instructions.  Will watch your condition.  Will get help right away if you are not doing well or get worse. Document Released: 11/09/2000 Document Revised: 05/21/2013 Document Reviewed: 12/03/2010 [facility] Patient Information 2015 [place], [place]. This information is not intended to replace advice given to you by your health care provider. Make sure you discuss any questions you have with your health care provider.   Dehydration, Adult Dehydration is when you lose more fluids from the body than you take in. Vital organs like the kidneys, brain, and heart cannot function without a proper amount of fluids and salt. Any loss of fluids from the body can cause dehydration.  CAUSES   Vomiting.  Diarrhea.  Excessive sweating.  Excessive urine output.  Fever. SYMPTOMS  Mild dehydration  Thirst.  Dry lips.  Slightly dry mouth. Moderate dehydration  Very dry mouth.  Sunken eyes.  Skin does not bounce back quickly when lightly pinched and released.  Dark urine and decreased urine production.  Decreased tear production.  Headache. Severe dehydration  Very dry mouth.  Extreme thirst.  Rapid, weak pulse (more than 100 beats per minute at rest).  Cold hands and feet.  Not able to sweat in spite of heat and temperature.  Rapid breathing.  Blue lips.  Confusion and lethargy.  Difficulty being awakened.  Minimal urine production.  No tears. DIAGNOSIS  Your caregiver will diagnose dehydration based on your symptoms and your exam. Blood and urine tests will help confirm the diagnosis. The diagnostic evaluation should also identify the cause of dehydration. TREATMENT  Treatment of mild or moderate dehydration can often be done at home by increasing the amount of fluids that you drink. It is best to drink small amounts of fluid more often. Drinking too much at one time can  make vomiting worse. Refer to the home care instructions below. Severe dehydration needs to be treated at the hospital where you will probably be given intravenous (IV) fluids that contain water and electrolytes. HOME CARE INSTRUCTIONS   Ask your caregiver about specific rehydration instructions.  Drink enough fluids to keep your urine clear or pale yellow.  Drink small amounts frequently if you have nausea and vomiting.  Eat as you normally do.  Avoid: 1. Foods or drinks high in sugar. 2. Carbonated drinks. 3. Juice. 4. Extremely hot or cold fluids. 5. Drinks with caffeine. 6. Fatty, greasy foods. 7. Alcohol. 8. Tobacco. 9. Overeating. 10. Gelatin desserts.  Wash your hands well to avoid spreading bacteria and viruses.  Only take over-the-counter or prescription medicines for pain, discomfort, or fever as directed by your caregiver.  Ask your caregiver if you should continue all prescribed and over-the-counter medicines.  Keep all follow-up appointments with your caregiver. SEEK MEDICAL CARE IF:  You have abdominal pain and it increases or stays in one area (localizes).  You have a rash, stiff neck, or severe headache.  You are irritable, sleepy, or difficult to awaken.  You are weak, dizzy, or extremely thirsty. SEEK IMMEDIATE MEDICAL CARE IF:  You are unable to keep fluids down or you get worse despite treatment.  You have frequent episodes of vomiting or diarrhea.  You have blood or green matter (bile) in your vomit.  You have blood in your stool or your stool looks black and tarry.  You have not urinated in 6 to 8 hours, or you have only urinated a small amount of very dark urine.  You have a fever.  You faint. MAKE SURE YOU:   Understand these instructions.  Will watch your condition.  Will get help right away if you are not doing well or get worse. Document Released: 05/16/2005 Document Revised: 08/08/2011 Document Reviewed: 01/03/2011 [facility]  Patient Information 2015 [place], [place]. This information is not intended to replace advice given to you by your health care provider. Make sure you discuss any questions you have with your health care provider.  Gastroenteritis:  Diarrhea Infections caused by germs (bacterial) or a virus commonly cause diarrhea. Your caregiver has determined that with time, rest and fluids, the diarrhea should improve. In general, eat normally while drinking more water than usual. Although water may prevent dehydration, it does not contain salt and minerals (electrolytes). Broths, weak tea without caffeine and oral rehydration solutions (ORS) replace fluids and electrolytes. Small amounts of fluids should be taken frequently. Large amounts at one time may not be tolerated. Plain water may be harmful in infants and the elderly. Oral rehydrating solutions (ORS) are available at pharmacies and grocery stores. ORS replace water and important electrolytes in proper proportions. Sports drinks are not as effective as ORS and may be harmful due to sugars worsening diarrhea.  ORS is especially recommended for use in children with diarrhea. As a general guideline for children, replace any new fluid losses from diarrhea and/or vomiting with ORS as follows:   If your child weighs 22 pounds or under (10 kg or less), give 60-120 mL ( -  cup or 2 - 4 ounces) of ORS for each episode of diarrheal stool or vomiting episode.   If your child weighs more than 22 pounds (more than 10 kgs), give 120-240 mL ( - 1 cup or 4 - 8 ounces) of ORS for each diarrheal stool or episode of vomiting.   While correcting for dehydration, children should eat normally. However, foods high in sugar should be avoided because this may worsen diarrhea. Large amounts of carbonated soft drinks, juice, gelatin desserts and other highly sugared drinks should be avoided.   After correction of dehydration, other liquids that are appealing to the child may be  added. Children should drink small amounts of fluids frequently and fluids should be increased as tolerated. Children should drink enough fluids to keep urine clear or pale yellow.   Adults should eat normally while drinking more fluids than usual. Drink small amounts of fluids frequently and increase as tolerated. Drink enough fluids to keep urine clear or pale yellow. Broths, weak decaffeinated tea, lemon lime soft drinks (allowed to go flat) and ORS replace fluids and electrolytes.   Avoid:   Carbonated drinks.   Juice.   Extremely hot or cold fluids.   Caffeine drinks.   Fatty, greasy foods.   Alcohol.   Tobacco.   Too much intake of anything at one time.   Gelatin desserts.   Probiotics are active cultures of beneficial bacteria. They may lessen the amount and number of diarrheal stools in adults. Probiotics can be found in yogurt with active cultures and in supplements.   Wash  hands well to avoid spreading bacteria and virus.   Anti-diarrheal medications are not recommended for infants and children.   Only take over-the-counter or prescription medicines for pain, discomfort or fever as directed by your caregiver. Do not give aspirin to children because it may cause Reye's Syndrome.   For adults, ask your caregiver if you should continue all prescribed and over-the-counter medicines.   If your caregiver has given you a follow-up appointment, it is very important to keep that appointment. Not keeping the appointment could result in a chronic or permanent injury, and disability. If there is any problem keeping the appointment, you must call back to this facility for assistance.  SEEK IMMEDIATE MEDICAL CARE IF:   You or your child is unable to keep fluids down or other symptoms or problems become worse in spite of treatment.   Vomiting or diarrhea develops and becomes persistent.   There is vomiting of blood or bile (green material).   There is blood in the stool or the  stools are black and tarry.   There is no urine output in 6-8 hours or there is only a small amount of very dark urine.   Abdominal pain develops, increases or localizes.   You have a fever.   Your baby is older than 3 months with a rectal temperature of 102 F (38.9 C) or higher.   Your baby is 18 months old or younger with a rectal temperature of 100.4 F (38 C) or higher.   You or your child develops excessive weakness, dizziness, fainting or extreme thirst.   You or your child develops a rash, stiff neck, severe headache or become irritable or sleepy and difficult to awaken.  MAKE SURE YOU:   Understand these instructions.   Will watch your condition.   Will get help right away if you are not doing well or get worse.  Document Released: 05/06/2002 Document Revised: 05/05/2011 Document Reviewed: 03/23/2009 [facility] Patient Information 2012 [place].  Nausea and Vomiting Nausea is a sick feeling that often comes before throwing up (vomiting). Vomiting is a reflex where stomach contents come out of your mouth. Vomiting can cause severe loss of body fluids (dehydration). Children and elderly adults can become dehydrated quickly, especially if they also have diarrhea. Nausea and vomiting are symptoms of a condition or disease. It is important to find the cause of your symptoms. CAUSES   Direct irritation of the stomach lining. This irritation can result from increased acid production (gastroesophageal reflux disease), infection, food poisoning, taking certain medicines (such as nonsteroidal anti-inflammatory drugs), alcohol use, or tobacco use.   Signals from the brain.These signals could be caused by a headache, heat exposure, an inner ear disturbance, increased pressure in the brain from injury, infection, a tumor, or a concussion, pain, emotional stimulus, or metabolic problems.   An obstruction in the gastrointestinal tract (bowel obstruction).   Illnesses such as  diabetes, hepatitis, gallbladder problems, appendicitis, kidney problems, cancer, sepsis, atypical symptoms of a heart attack, or eating disorders.   Medical treatments such as chemotherapy and radiation.   Receiving medicine that makes you sleep (general anesthetic) during surgery.  DIAGNOSIS Your caregiver may ask for tests to be done if the problems do not improve after a few days. Tests may also be done if symptoms are severe or if the reason for the nausea and vomiting is not clear. Tests may include:  Urine tests.   Blood tests.   Stool tests.   Cultures (to look  for evidence of infection).   X-rays or other imaging studies.  Test results can help your caregiver make decisions about treatment or the need for additional tests. TREATMENT You need to stay well hydrated. Drink frequently but in small amounts.You may wish to drink water, sports drinks, clear broth, or eat frozen ice pops or gelatin dessert to help stay hydrated.When you eat, eating slowly may help prevent nausea.There are also some antinausea medicines that may help prevent nausea. HOME CARE INSTRUCTIONS   Take all medicine as directed by your caregiver.   If you do not have an appetite, do not force yourself to eat. However, you must continue to drink fluids.   If you have an appetite, eat a normal diet unless your caregiver tells you differently.   Eat a variety of complex carbohydrates (rice, wheat, potatoes, bread), lean meats, yogurt, fruits, and vegetables.   Avoid high-fat foods because they are more difficult to digest.   Drink enough water and fluids to keep your urine clear or pale yellow.   If you are dehydrated, ask your caregiver for specific rehydration instructions. Signs of dehydration may include:   Severe thirst.   Dry lips and mouth.   Dizziness.   Dark urine.   Decreasing urine frequency and amount.   Confusion.   Rapid breathing or pulse.  SEEK IMMEDIATE MEDICAL CARE IF:    You have blood or brown flecks (like coffee grounds) in your vomit.   You have black or bloody stools.   You have a severe headache or stiff neck.   You are confused.   You have severe abdominal pain.   You have chest pain or trouble breathing.   You do not urinate at least once every 8 hours.   You develop cold or clammy skin.   You continue to vomit for longer than 24 to 48 hours.   You have a fever.  MAKE SURE YOU:   Understand these instructions.   Will watch your condition.   Will get help right away if you are not doing well or get worse.  Document Released: 05/16/2005 Document Revised: 05/05/2011 Document Reviewed: 10/13/2010 [facility] Patient Information 2012 [place], [place].  Return to the clinic or go to the nearest emergency room if any of your symptoms worsen or new symptoms occur.

## 2014-08-08 ENCOUNTER — Telehealth: Payer: Self-pay

## 2014-08-08 NOTE — Telephone Encounter (Signed)
PA needed on zofran ODT. Called [employer] and did PA over the phone. Approved through 05/29/2221. Notified pharm.

## 2015-01-28 IMAGING — CR DG PELVIS 1-2V
1 series · 1 of 1 positions shown · non-contrast
Comparison: None.

CLINICAL DATA: Status post fall.  Pain.

EXAM:
PELVIS - 1-2 VIEW

[t pelvis a.p.]
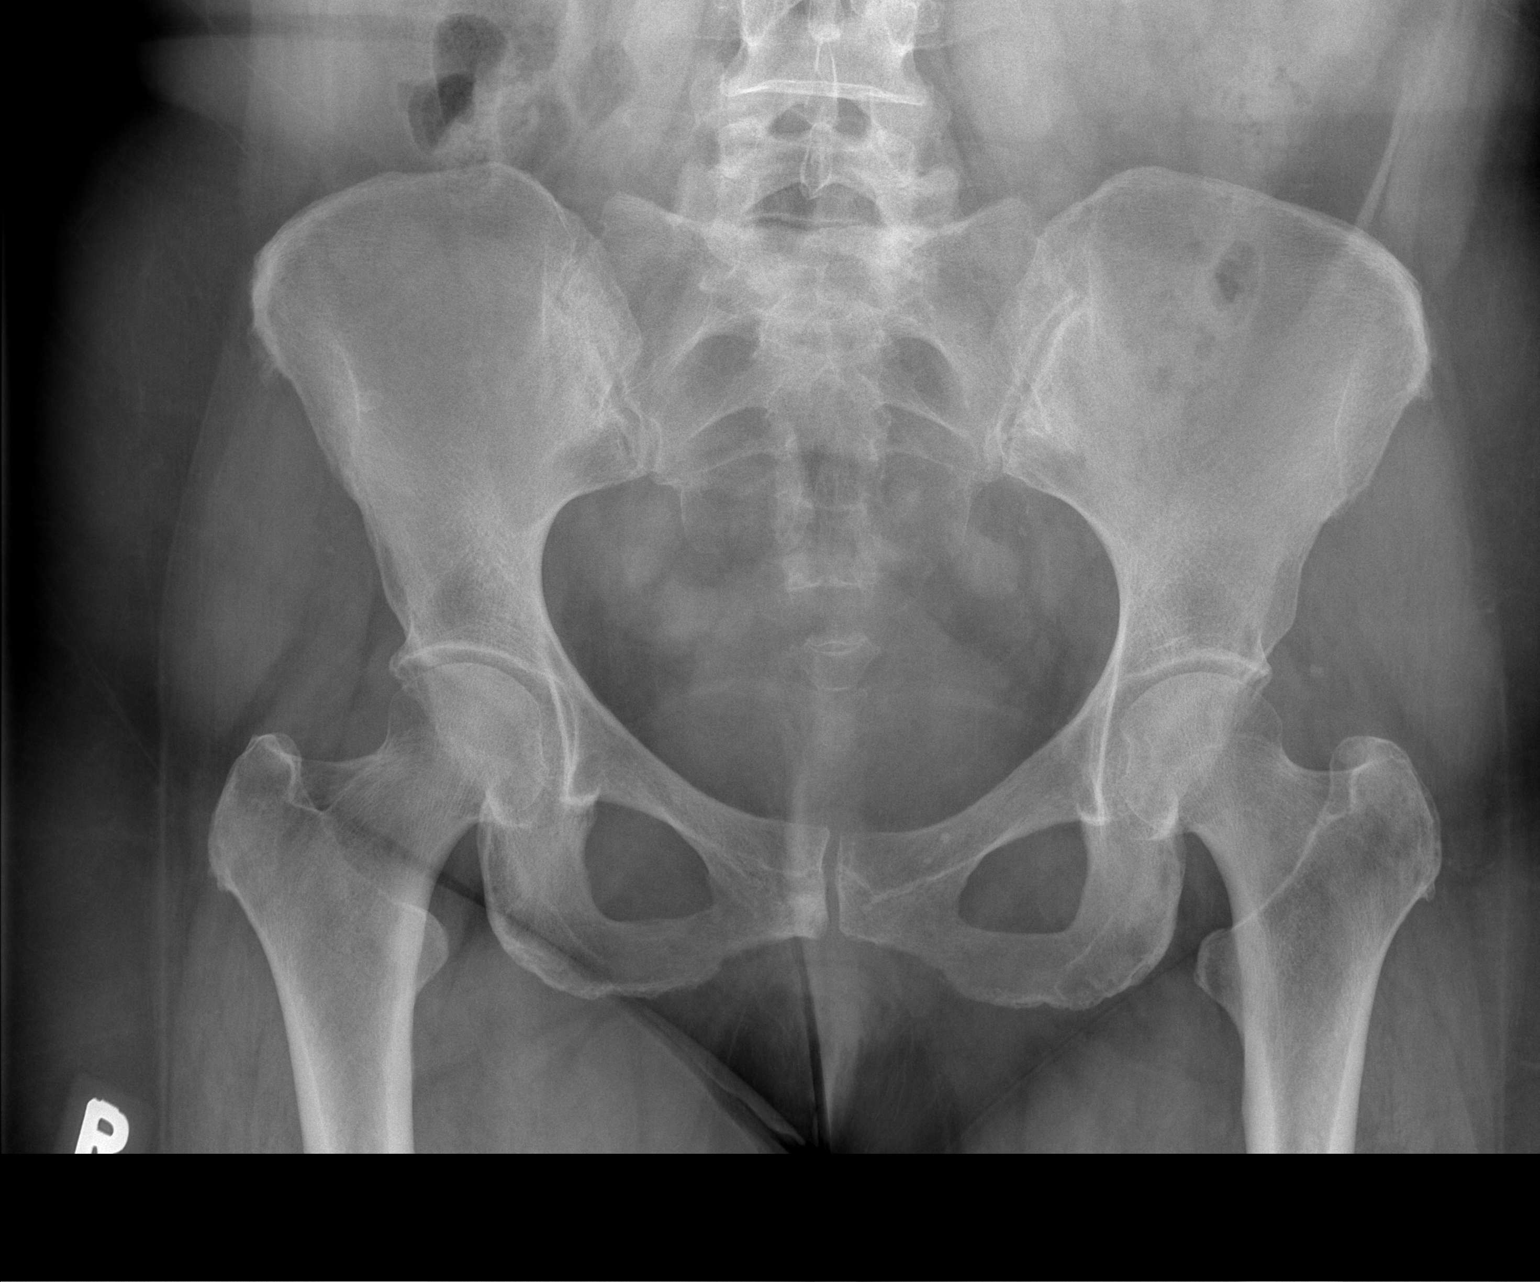

[1 of 1 positions shown; findings below may reference images not displayed]

FINDINGS: Both hips are located. There is no fracture. No notable degenerative
change is identified. Soft tissue structures are unremarkable.
IMPRESSION: Negative exam.

## 2016-07-06 ENCOUNTER — Emergency Department (HOSPITAL_COMMUNITY): Payer: Medicare Other

## 2016-07-06 ENCOUNTER — Observation Stay (HOSPITAL_COMMUNITY)
Admission: EM | Admit: 2016-07-06 | Discharge: 2016-07-07 | Disposition: A | Discharge status: Home / Self Care | Payer: Medicare Other | Attending: Internal Medicine | Admitting: Internal Medicine

## 2016-07-06 ENCOUNTER — Encounter (HOSPITAL_COMMUNITY): Payer: Self-pay

## 2016-07-06 DIAGNOSIS — E785 Hyperlipidemia, unspecified: Secondary | ICD-10-CM | POA: Diagnosis present

## 2016-07-06 DIAGNOSIS — B9689 Other specified bacterial agents as the cause of diseases classified elsewhere: Secondary | ICD-10-CM | POA: Diagnosis not present

## 2016-07-06 DIAGNOSIS — I700 Atherosclerosis of aorta: Secondary | ICD-10-CM | POA: Insufficient documentation

## 2016-07-06 DIAGNOSIS — R079 Chest pain, unspecified: Principal | ICD-10-CM | POA: Diagnosis present

## 2016-07-06 DIAGNOSIS — M1990 Unspecified osteoarthritis, unspecified site: Secondary | ICD-10-CM | POA: Diagnosis not present

## 2016-07-06 DIAGNOSIS — Z8249 Family history of ischemic heart disease and other diseases of the circulatory system: Secondary | ICD-10-CM | POA: Insufficient documentation

## 2016-07-06 DIAGNOSIS — I10 Essential (primary) hypertension: Secondary | ICD-10-CM | POA: Diagnosis present

## 2016-07-06 DIAGNOSIS — R0602 Shortness of breath: Secondary | ICD-10-CM | POA: Diagnosis not present

## 2016-07-06 DIAGNOSIS — F419 Anxiety disorder, unspecified: Secondary | ICD-10-CM | POA: Diagnosis present

## 2016-07-06 DIAGNOSIS — N390 Urinary tract infection, site not specified: Secondary | ICD-10-CM | POA: Insufficient documentation

## 2016-07-06 DIAGNOSIS — D72829 Elevated white blood cell count, unspecified: Secondary | ICD-10-CM | POA: Diagnosis present

## 2016-07-06 LAB — CBC
HCT: 39.5 % (ref 36.0–46.0)
Hemoglobin: 12.6 g/dL (ref 12.0–15.0)
MCH: 27.1 pg (ref 26.0–34.0)
MCHC: 31.9 g/dL (ref 30.0–36.0)
MCV: 84.9 fL (ref 78.0–100.0)
PLATELETS: 293 10*3/uL (ref 150–400)
RBC: 4.65 MIL/uL (ref 3.87–5.11)
RDW: 14.6 % (ref 11.5–15.5)
WBC: 11.8 10*3/uL — ABNORMAL HIGH (ref 4.0–10.5)

## 2016-07-06 LAB — TROPONIN I: Troponin I: <0.03 ng/mL (ref <0.03)

## 2016-07-06 LAB — BASIC METABOLIC PANEL
Anion gap: 10 (ref 5–15)
BUN: 9 mg/dL (ref 6–20)
CALCIUM: 8.8 mg/dL — AB (ref 8.9–10.3)
CHLORIDE: 104 mmol/L (ref 101–111)
CO2: 27 mmol/L (ref 22–32)
CREATININE: 0.77 mg/dL (ref 0.44–1.00)
GFR calc non Af Amer: >60 mL/min (ref >60)
Glucose, Bld: 100 mg/dL — ABNORMAL HIGH (ref 65–99)
Potassium: 3.9 mmol/L (ref 3.5–5.1)
Sodium: 141 mmol/L (ref 135–145)

## 2016-07-06 LAB — I-STAT TROPONIN, ED: TROPONIN I, POC: 0 ng/mL (ref 0.00–0.08)

## 2016-07-06 LAB — D-DIMER, QUANTITATIVE: D-Dimer, Quant: <0.27 ug/mL-FEU (ref 0.00–0.50)

## 2016-07-06 MED ORDER — MORPHINE SULFATE (PF) 4 MG/ML IV SOLN: 2.0000 mg | INTRAVENOUS | Status: DC | PRN

## 2016-07-06 MED ORDER — ACETAMINOPHEN 325 MG PO TABS: 650.0000 mg | ORAL_TABLET | Freq: Four times a day (QID) | ORAL | Status: DC | PRN

## 2016-07-06 MED ORDER — ASPIRIN EC 81 MG PO TBEC
81.0000 mg | DELAYED_RELEASE_TABLET | Freq: Every day | ORAL | Status: DC
  Administered 2016-07-07: 81 mg via ORAL
  Filled 2016-07-06: qty 1

## 2016-07-06 MED ORDER — ENOXAPARIN SODIUM 40 MG/0.4ML ~~LOC~~ SOLN: 40.0000 mg | SUBCUTANEOUS | Status: DC

## 2016-07-06 MED ORDER — ONDANSETRON 4 MG PO TBDP: 4.0000 mg | ORAL_TABLET | Freq: Three times a day (TID) | ORAL | Status: DC | PRN

## 2016-07-06 MED ORDER — GI COCKTAIL ~~LOC~~: 30.0000 mL | Freq: Four times a day (QID) | ORAL | Status: DC | PRN

## 2016-07-06 MED ORDER — HYDRALAZINE HCL 20 MG/ML IJ SOLN: 5.0000 mg | INTRAMUSCULAR | Status: DC | PRN

## 2016-07-06 MED ORDER — LORAZEPAM 1 MG PO TABS
0.5000 mg | ORAL_TABLET | Freq: Once | ORAL | Status: AC
  Administered 2016-07-06: 0.5 mg via ORAL
  Filled 2016-07-06: qty 1

## 2016-07-06 MED ORDER — ENOXAPARIN SODIUM 40 MG/0.4ML ~~LOC~~ SOLN
40.0000 mg | SUBCUTANEOUS | Status: DC
  Administered 2016-07-06: 40 mg via SUBCUTANEOUS
  Filled 2016-07-06: qty 0.4

## 2016-07-06 MED ORDER — ACETAMINOPHEN 650 MG RE SUPP: 650.0000 mg | Freq: Four times a day (QID) | RECTAL | Status: DC | PRN

## 2016-07-06 MED ORDER — ASPIRIN 81 MG PO CHEW
324.0000 mg | CHEWABLE_TABLET | Freq: Once | ORAL | Status: AC
  Administered 2016-07-06: 324 mg via ORAL
  Filled 2016-07-06: qty 4

## 2016-07-06 NOTE — ED Notes (Signed)
Patient transported to X-ray 

## 2016-07-06 NOTE — ED Notes (Signed)
Patient denies pain and is resting comfortably.  

## 2016-07-06 NOTE — Progress Notes (Signed)
Pt. admitted to the unit. Patient mental status is alert and oriented. Pt oriented to room, staff and call bell. Skin is intact. Full assessment charted in [facility]. Call bell within reach. Patient speaks Spanish, daughter stated she will stay the night and help translate.

## 2016-07-06 NOTE — ED Provider Notes (Signed)
[facility] DEPT Provider Note   CSN: [id number] Arrival date & time: 07/06/16  0341  By signing my name below, I, Arianna Nassar, attest that this documentation has been prepared under the direction and in the presence of Merryl Hacker, MD.  Electronically Signed: Julien Nordmann, ED Scribe. 07/06/16. 4:21 AM.    History   Chief Complaint Chief Complaint  Patient presents with  . Chest Pain    The history is provided by the patient. No language interpreter was used.   HPI Comments: Brandi Moon is a 71 y.o. female who presents to the Emergency Department complaining of intermittent, gradual worsening chest pain x 2 days. Current pain is 5 out of 10. She describes the pain as a pressure-like sensation. She reports associated shortness of breath. Per patient's daughter who is an ED nurse, patient has had frequent symptoms over the last month. Sometimes related to exertion. Symptoms have become more frequent. Patient is very tearful and "I feel like something bad is going to happen." Pt has been under increased stress at home for the past several months which is possibly contributing to her symptoms. Pt has a hx of uterine cancer. Pt does not have a hx of DM, HTN, CAD or MI. She has not seen a cardiologist or had a stress test performed in the past. She has been around positive sick contacts with the flu. She has not had any fever, cough, or leg swelling.  Past Medical History:  Diagnosis Date  . Arthritis   . Cancer [facility])    uterine    There are no active problems to display for this patient.   History reviewed. No pertinent surgical history.  OB History    No data available       Home Medications    Prior to Admission medications   Medication Sig Start Date End Date Taking? Authorizing Provider  guaiFENesin (ROBITUSSIN) 100 MG/5ML liquid Take 200 mg by mouth 3 (three) times daily as needed for cough.   Yes Historical Provider, MD  ondansetron (ZOFRAN ODT) 4 MG  disintegrating tablet Take 1 tablet (4 mg total) by mouth every 8 (eight) hours as needed for nausea or vomiting. 08/07/14  Yes Wendie Agreste, MD    Family History No family history on file.  Social History Social History  Substance Use Topics  . Smoking status: Never Smoker  . Smokeless tobacco: Never Used  . Alcohol use No     Allergies   Patient has no known allergies.   Review of Systems Review of Systems  Constitutional: Negative for fever.  Respiratory: Positive for shortness of breath. Negative for cough.   Cardiovascular: Positive for chest pain. Negative for leg swelling.  Gastrointestinal: Negative for abdominal pain, nausea and vomiting.  All other systems reviewed and are negative.    Physical Exam Updated Vital Signs BP 146/62   Pulse 71   Temp 98.4 F (36.9 C) (Oral)   Resp 20   Ht 5\' 4"  (1.626 m)   Wt 185 lb (83.9 kg)   SpO2 100%   BMI 31.76 kg/m   Physical Exam  Constitutional: She is oriented to person, place, and time. She appears well-developed and well-nourished.  Tearful, no acute distress  HENT:  Head: Normocephalic and atraumatic.  Cardiovascular: Normal rate, regular rhythm and normal heart sounds.   No murmur heard. Pulmonary/Chest: Effort normal and breath sounds normal. No respiratory distress. She has no wheezes.  Abdominal: Soft. There is no tenderness.  Musculoskeletal:  She exhibits no edema.  Neurological: She is alert and oriented to person, place, and time.  Skin: Skin is warm and dry.  Psychiatric: She has a normal mood and affect.  Nursing note and vitals reviewed.    ED Treatments / Results  DIAGNOSTIC STUDIES: Oxygen Saturation is 100% on RA, normal by my interpretation.  COORDINATION OF CARE: 4:19 AM Discussed treatment plan with pt at bedside and pt agreed to plan.  Labs (all labs ordered are listed, but only abnormal results are displayed) Labs Reviewed  BASIC METABOLIC PANEL - Abnormal; Notable for the  following:       Result Value   Glucose, Bld 100 (*)    Calcium 8.8 (*)    All other components within normal limits  CBC - Abnormal; Notable for the following:    WBC 11.8 (*)    All other components within normal limits  D-DIMER, QUANTITATIVE (NOT AT [facility])  Randolm Idol, ED    EKG  EKG Interpretation  Date/Time:  Wednesday July 06 2016 03:48:11 EST Ventricular Rate:  72 PR Interval:  110 QRS Duration: 78 QT Interval:  368 QTC Calculation: 402 R Axis:   -24 Text Interpretation:  Sinus rhythm with short PR Otherwise normal ECG Confirmed by Dina Rich  MD, Vinicius Brockman ([id number]) on 07/06/2016 4:21:51 AM       Radiology Dg Chest 2 View  Result Date: 07/06/2016 CLINICAL DATA:  71 y/o  F; chest pain and shortness of breath EXAM: CHEST  2 VIEW COMPARISON:  08/07/2014 chest radiograph FINDINGS: Stable heart size and mediastinal contours are within normal limits. Both lungs are clear. The visualized skeletal structures are unremarkable. Aortic atherosclerosis with calcification. IMPRESSION: No active cardiopulmonary disease. Electronically Signed   By: Kristine Garbe M.D.   On: 07/06/2016 04:16    Procedures Procedures (including critical care time)  Medications Ordered in ED Medications  LORazepam (ATIVAN) tablet 0.5 mg (0.5 mg Oral Given 07/06/16 0433)  aspirin chewable tablet 324 mg (324 mg Oral Given 07/06/16 0433)     Initial Impression / Assessment and Plan / ED Course  I have reviewed the triage vital signs and the nursing notes.  Pertinent labs & imaging results that were available during my care of the patient were reviewed by me and considered in my medical decision making (see chart for details).    Patient presents for chest pain. Appears to be ongoing and worsened recently. Denies any cardiac risk factors but is hypertensive at 146/62. EKG is reassuring. Chest x-ray negative. Troponin and d-dimer initially negative. Heart score is 4 if you count hypertension.  Given patient age and increasing frequency of symptoms, feel prudent that she be admitted for formal rule out and stress testing.  Final Clinical Impressions(s) / ED Diagnoses   Final diagnoses:  Chest pain, unspecified type   I personally performed the services described in this documentation, which was scribed in my presence. The recorded information has been reviewed and is accurate.  New Prescriptions New Prescriptions   No medications on file     Merryl Hacker, MD 07/06/16 [phone]

## 2016-07-06 NOTE — ED Notes (Signed)
Report called to rn on 2w

## 2016-07-06 NOTE — ED Notes (Signed)
No pain she appears comfortable

## 2016-07-06 NOTE — ED Notes (Signed)
Back from xray

## 2016-07-06 NOTE — H&P (Signed)
History and Physical    Brandi Moon DOB: 12-01-1945 DOA: 07/06/2016  PCP: Jennye Moccasin, MD Patient coming from: Home  Chief Complaint: CP  HPI: Brandi Moon is a 71 y.o. female with medical history significant of arthritis and uterine cancer. Family history significant for father who had a heart attack in his 68s. Patient reports approximately 4 week history of worsening stress and anxiety with intermittent episodes of chest pain is sometimes described as chest pressure. Associated with shortness of breath. Typically comes on in the morning. Comes and goes. Non-exertional. Denies any palpitations, orthopnea, radiation of the pain to her neck or arm. Patient states that her lower extremity swelling is no different than her baseline and is only intermittent.   ED Course: Ativan and aspirin given. EKG reassuring.  Review of Systems: As per HPI otherwise 10 point review of systems negative.   Ambulatory Status:no restrictions  Past Medical History:  Diagnosis Date  . Arthritis   . Cancer [facility])    uterine    Past Surgical History:  Procedure Laterality Date  . histerectomy      Social History   Social History  . Marital status: Widowed    Spouse name: N/A  . Number of children: N/A  . Years of education: N/A   Occupational History  . Not on file.   Social History Main Topics  . Smoking status: Never Smoker  . Smokeless tobacco: Never Used  . Alcohol use No  . Drug use: No  . Sexual activity: Not on file   Other Topics Concern  . Not on file   Social History Narrative  . No narrative on file    No Known Allergies  Family History  Problem Relation Age of Onset  . COPD Mother   . Heart attack Father     Prior to Admission medications   Medication Sig Start Date End Date Taking? Authorizing Provider  guaiFENesin (ROBITUSSIN) 100 MG/5ML liquid Take 200 mg by mouth 3 (three) times daily as needed for cough.   Yes Historical  Provider, MD  ondansetron (ZOFRAN ODT) 4 MG disintegrating tablet Take 1 tablet (4 mg total) by mouth every 8 (eight) hours as needed for nausea or vomiting. 08/07/14  Yes Wendie Agreste, MD    Physical Exam: Vitals:   07/06/16 0530 07/06/16 0600 07/06/16 0700 07/06/16 0800  BP: 132/59 118/67 122/56 128/63  Pulse: 67 64 (!) 58 60  Resp: 19 16 16 21   Temp:      TempSrc:      SpO2: 94% 96% 97% 98%  Weight:      Height:         General:  Appears calm and comfortable Eyes:  PERRL, EOMI, normal lids, iris ENT:  grossly normal hearing, lips & tongue, mmm Neck:  no LAD, masses or thyromegaly Cardiovascular:  RRR, no m/r/g. trace LE edema.  Respiratory:  CTA bilaterally, no w/r/r. Normal respiratory effort. Abdomen:  soft, ntnd, NABS Skin:  no rash or induration seen on limited exam Musculoskeletal:  grossly normal tone BUE/BLE, good ROM, no bony abnormality Psychiatric:  grossly normal mood and affect, speech fluent and appropriate, AOx3 Neurologic:  CN 2-12 grossly intact, moves all extremities in coordinated fashion, sensation intact  Labs on Admission: I have personally reviewed following labs and imaging studies  CBC:  Recent Labs Lab 07/06/16 0348  WBC 11.8*  HGB 12.6  HCT 39.5  MCV 84.9  PLT 0000000   Basic Metabolic Panel:  Recent Labs Lab 07/06/16 0348  NA 141  K 3.9  CL 104  CO2 27  GLUCOSE 100*  BUN 9  CREATININE 0.77  CALCIUM 8.8*   GFR: Estimated Creatinine Clearance: 68.6 mL/min (by C-G formula based on SCr of 0.77 mg/dL). Liver Function Tests: No results for input(s): AST, ALT, ALKPHOS, BILITOT, PROT, ALBUMIN in the last 168 hours. No results for input(s): LIPASE, AMYLASE in the last 168 hours. No results for input(s): AMMONIA in the last 168 hours. Coagulation Profile: No results for input(s): INR, PROTIME in the last 168 hours. Cardiac Enzymes: No results for input(s): CKTOTAL, CKMB, CKMBINDEX, TROPONINI in the last 168 hours. BNP (last 3  results) No results for input(s): PROBNP in the last 8760 hours. HbA1C: No results for input(s): HGBA1C in the last 72 hours. CBG: No results for input(s): GLUCAP in the last 168 hours. Lipid Profile: No results for input(s): CHOL, HDL, LDLCALC, TRIG, CHOLHDL, LDLDIRECT in the last 72 hours. Thyroid Function Tests: No results for input(s): TSH, T4TOTAL, FREET4, T3FREE, THYROIDAB in the last 72 hours. Anemia Panel: No results for input(s): VITAMINB12, FOLATE, FERRITIN, TIBC, IRON, RETICCTPCT in the last 72 hours. Urine analysis: No results found for: COLORURINE, APPEARANCEUR, LABSPEC, PHURINE, GLUCOSEU, HGBUR, BILIRUBINUR, KETONESUR, PROTEINUR, UROBILINOGEN, NITRITE, LEUKOCYTESUR  Creatinine Clearance: Estimated Creatinine Clearance: 68.6 mL/min (by C-G formula based on SCr of 0.77 mg/dL).  Sepsis Labs: @LABRCNTIP (procalcitonin:4,lacticidven:4) )No results found for this or any previous visit (from the past 240 hour(s)).   Radiological Exams on Admission: Dg Chest 2 View  Result Date: 07/06/2016 CLINICAL DATA:  71 y/o  F; chest pain and shortness of breath EXAM: CHEST  2 VIEW COMPARISON:  08/07/2014 chest radiograph FINDINGS: Stable heart size and mediastinal contours are within normal limits. Both lungs are clear. The visualized skeletal structures are unremarkable. Aortic atherosclerosis with calcification. IMPRESSION: No active cardiopulmonary disease. Electronically Signed   By: Kristine Garbe M.D.   On: 07/06/2016 04:16    EKG: Independently reviewed. NSR, no ACS  Assessment/Plan Active Problems:   Chest pain   CP: Heart score 4. No previous cardiac workup. Father w/ h/o heart attack. Anxiety vs cardiac vs. GI. - NM stress - Cards aware of stress test order but no formal consult unless warranted based on results - tele - cycle trop - EKG in am - A1c, lipid - Gi cocktail - Start daily ASA 81.  Anxiety: Pt under increased stress lately. Atvian given in ED. Pt  resistant to pharmacotherapy at this time - Monitor. Consider Benzo if needed  HTN: intermittent. No need to start antihypertensives at this time - Hydralazine PRN  Leukocytosis: 11.8. AFVSS. Suspect stress related - CBC in am   DVT prophylaxis: Lovenox  Code Status: FULL  Family Communication: daughter - Larence Penning ED nurse  Disposition Plan: pending NM stress test and CP r/o  Consults called: none - Cards aware of stress test.  Admission status: obs    MERRELL, DAVID J MD [facility]  If 7PM-7AM, please contact night-coverage [email or web address] Password [facility]  07/06/2016, 9:31 AM

## 2016-07-06 NOTE — ED Triage Notes (Signed)
Pt started having CP two days ago along with SOB, denies n/v, no radiation.

## 2016-07-06 NOTE — ED Notes (Signed)
Unable to take report  rn just received a new pt

## 2016-07-07 ENCOUNTER — Observation Stay (HOSPITAL_BASED_OUTPATIENT_CLINIC_OR_DEPARTMENT_OTHER): Payer: Medicare Other

## 2016-07-07 DIAGNOSIS — R079 Chest pain, unspecified: Secondary | ICD-10-CM

## 2016-07-07 DIAGNOSIS — Z8249 Family history of ischemic heart disease and other diseases of the circulatory system: Secondary | ICD-10-CM | POA: Diagnosis not present

## 2016-07-07 DIAGNOSIS — M1990 Unspecified osteoarthritis, unspecified site: Secondary | ICD-10-CM | POA: Diagnosis not present

## 2016-07-07 DIAGNOSIS — I700 Atherosclerosis of aorta: Secondary | ICD-10-CM | POA: Diagnosis not present

## 2016-07-07 DIAGNOSIS — E785 Hyperlipidemia, unspecified: Secondary | ICD-10-CM | POA: Diagnosis not present

## 2016-07-07 DIAGNOSIS — I10 Essential (primary) hypertension: Secondary | ICD-10-CM | POA: Diagnosis not present

## 2016-07-07 DIAGNOSIS — F419 Anxiety disorder, unspecified: Secondary | ICD-10-CM

## 2016-07-07 DIAGNOSIS — D72829 Elevated white blood cell count, unspecified: Secondary | ICD-10-CM

## 2016-07-07 DIAGNOSIS — N390 Urinary tract infection, site not specified: Secondary | ICD-10-CM | POA: Diagnosis not present

## 2016-07-07 DIAGNOSIS — B9689 Other specified bacterial agents as the cause of diseases classified elsewhere: Secondary | ICD-10-CM | POA: Diagnosis not present

## 2016-07-07 LAB — URINALYSIS, ROUTINE W REFLEX MICROSCOPIC
Bilirubin Urine: NEGATIVE
GLUCOSE, UA: NEGATIVE mg/dL
KETONES UR: NEGATIVE mg/dL
Leukocytes, UA: NEGATIVE
NITRITE: NEGATIVE
PROTEIN: NEGATIVE mg/dL
Specific Gravity, Urine: 1.015 (ref 1.005–1.030)
pH: 8 (ref 5.0–8.0)

## 2016-07-07 LAB — CBC
HCT: 38.7 % (ref 36.0–46.0)
HEMOGLOBIN: 12.6 g/dL (ref 12.0–15.0)
MCH: 27.3 pg (ref 26.0–34.0)
MCHC: 32.6 g/dL (ref 30.0–36.0)
MCV: 83.9 fL (ref 78.0–100.0)
Platelets: 280 10*3/uL (ref 150–400)
RBC: 4.61 MIL/uL (ref 3.87–5.11)
RDW: 14.5 % (ref 11.5–15.5)
WBC: 8.4 10*3/uL (ref 4.0–10.5)

## 2016-07-07 LAB — NM MYOCAR MULTI W/SPECT W/WALL MOTION / EF
CSEPPHR: 106 {beats}/min
Rest HR: 64 {beats}/min

## 2016-07-07 LAB — BASIC METABOLIC PANEL
ANION GAP: 8 (ref 5–15)
BUN: 10 mg/dL (ref 6–20)
CHLORIDE: 105 mmol/L (ref 101–111)
CO2: 26 mmol/L (ref 22–32)
Calcium: 8.4 mg/dL — ABNORMAL LOW (ref 8.9–10.3)
Creatinine, Ser: 0.83 mg/dL (ref 0.44–1.00)
GFR calc Af Amer: >60 mL/min (ref >60)
GFR calc non Af Amer: >60 mL/min (ref >60)
GLUCOSE: 94 mg/dL (ref 65–99)
POTASSIUM: 3.6 mmol/L (ref 3.5–5.1)
SODIUM: 139 mmol/L (ref 135–145)

## 2016-07-07 LAB — LIPID PANEL
CHOL/HDL RATIO: 3.4 ratio
Cholesterol: 185 mg/dL (ref 0–200)
HDL: 54 mg/dL (ref >40)
LDL Cholesterol: 115 mg/dL — ABNORMAL HIGH (ref 0–99)
Triglycerides: 81 mg/dL (ref <150)
VLDL: 16 mg/dL (ref 0–40)

## 2016-07-07 MED ORDER — METOPROLOL SUCCINATE ER 25 MG PO TB24: 25.0000 mg | ORAL_TABLET | Freq: Every day | ORAL | 2 refills | Status: DC

## 2016-07-07 MED ORDER — ATORVASTATIN CALCIUM 20 MG PO TABS: 20.0000 mg | ORAL_TABLET | Freq: Every day | ORAL | 2 refills | Status: DC

## 2016-07-07 MED ORDER — ATORVASTATIN CALCIUM 20 MG PO TABS
20.0000 mg | ORAL_TABLET | Freq: Every day | ORAL | Status: DC
  Administered 2016-07-07: 20 mg via ORAL
  Filled 2016-07-07: qty 1

## 2016-07-07 MED ORDER — ASPIRIN 81 MG PO TBEC: 81.0000 mg | DELAYED_RELEASE_TABLET | Freq: Every day | ORAL | Status: DC

## 2016-07-07 MED ORDER — TECHNETIUM TC 99M TETROFOSMIN IV KIT
10.0000 | PACK | Freq: Once | INTRAVENOUS | Status: AC | PRN
  Administered 2016-07-07: 10 via INTRAVENOUS

## 2016-07-07 MED ORDER — REGADENOSON 0.4 MG/5ML IV SOLN
INTRAVENOUS | Status: AC
  Administered 2016-07-07: 0.4 mg via INTRAVENOUS
  Filled 2016-07-07: qty 5

## 2016-07-07 MED ORDER — REGADENOSON 0.4 MG/5ML IV SOLN
0.4000 mg | Freq: Once | INTRAVENOUS | Status: AC
  Administered 2016-07-07: 0.4 mg via INTRAVENOUS
  Filled 2016-07-07: qty 5

## 2016-07-07 NOTE — Discharge Summary (Addendum)
Physician Discharge Summary  NIMAH ILL [id number] DOB: Jun 15, 1945 DOA: 07/06/2016  PCP: Jennye Moccasin, MD  Admit date: 07/06/2016 Discharge date: 07/07/2016  Admitted From: Home Discharge disposition: Home   Recommendations for Outpatient Follow-Up:   1. The patient was started on Lipitor. Please follow-up LFTs in 6 weeks. 2. Note: Urine culture + for Aerococcus Urinae.  Patient notified by telephone and a prescription for Amoxicillin 500 mg TID x 7 days was called in to her pharmacy.   Discharge Diagnosis:   Principal Problem:    Chest pain Active Problems:    Anxiety    Hypertension    Leukocytosis    Hyperlipidemia    Aerococcus Urinae UTI  Discharge Condition: Improved.  Diet recommendation: Low sodium, heart healthy.    History of Present Illness:   Brandi Moon is an 71 y.o. female with a PMH of uterine cancer and a family history of heart disease who was admitted 07/06/16 for evaluation of chest pain. Upon initial evaluation in the ED, EKG was without ischemic changes. Heart score was 4. Initial troponin negative.   Hospital Course by Problem:   Principal Problem:   Chest pain Chest x-ray unremarkable. Troponins remain negative. Repeat EKG this morning shows normal sinus rhythm at 62 bpm and no ischemic changes (personally reviewed). Continue aspirin. Stress test Low risk study. LDL elevated at 115, so will start statin. Okay to discharge home.  Active Problems:   Hyperlipidemia Lipitor started.    Anxiety Supportive care.    Hypertension We'll start on beta blocker therapy.    Leukocytosis Resolved.    Medical Consultants:    None.   Discharge Exam:   Vitals:   07/07/16 1313 07/07/16 1315  BP: (!) 155/71 (!) 160/72  Pulse:    Resp:    Temp:     Vitals:   07/07/16 1231 07/07/16 1311 07/07/16 1313 07/07/16 1315  BP: (!) 144/72 (!) 154/69 (!) 155/71 (!) 160/72  Pulse:      Resp:      Temp:      TempSrc:       SpO2:      Weight:      Height:        General exam: Appears calm and comfortable.  Respiratory system: Clear to auscultation. Respiratory effort normal. Cardiovascular system: S1 & S2 heard, RRR. No JVD,  rubs, gallops or clicks. No murmurs. Gastrointestinal system: Abdomen is nondistended, soft and nontender. No organomegaly or masses felt. Normal bowel sounds heard. Central nervous system: Alert and oriented. No focal neurological deficits. Extremities: No clubbing,  or cyanosis. No edema. Skin: No rashes, lesions or ulcers. Psychiatry: Judgement and insight appear normal. Mood & affect appropriate.    The results of significant diagnostics from this hospitalization (including imaging, microbiology, ancillary and laboratory) are listed below for reference.     Procedures and Diagnostic Studies:   Dg Chest 2 View  Result Date: 07/06/2016 CLINICAL DATA:  71 y/o  F; chest pain and shortness of breath EXAM: CHEST  2 VIEW COMPARISON:  08/07/2014 chest radiograph FINDINGS: Stable heart size and mediastinal contours are within normal limits. Both lungs are clear. The visualized skeletal structures are unremarkable. Aortic atherosclerosis with calcification. IMPRESSION: No active cardiopulmonary disease. Electronically Signed   By: Kristine Garbe M.D.   On: 07/06/2016 04:16   Nm Myocar Multi W/spect W/wall Motion / Ef  Result Date: 07/07/2016 CLINICAL DATA:  Chest pain. Family history of coronary disease. Shortness of breath. EXAM: MYOCARDIAL  IMAGING WITH SPECT (REST AND PHARMACOLOGIC-STRESS) GATED LEFT VENTRICULAR WALL MOTION STUDY LEFT VENTRICULAR EJECTION FRACTION TECHNIQUE: Standard myocardial SPECT imaging was performed after resting intravenous injection of 10 mCi Tc-91m tetrofosmin. Subsequently, intravenous infusion of Lexiscan was performed under the supervision of the Cardiology staff. At peak effect of the drug, 30 mCi Tc-55m tetrofosmin was injected intravenously and  standard myocardial SPECT imaging was performed. Quantitative gated imaging was also performed to evaluate left ventricular wall motion, and estimate left ventricular ejection fraction. COMPARISON:  Plain films of 07/06/2016 FINDINGS: Perfusion: Mild rest decreased uptake in the septal wall. No areas of reversibility to suggest inducible ischemia. Wall Motion: Hypokinesis involving the septal wall. Left Ventricular Ejection Fraction: 55 % End diastolic volume 61 ml End systolic volume 28 ml IMPRESSION: 1. No evidence of reversible ischemia. Mildly decreased rest uptake in the septal wall may represent scar. 2. Hypokinesis involving the septal wall. 3. Left ventricular ejection fraction 55 % 4. Non invasive risk stratification*: Low *2012 Appropriate Use Criteria for Coronary Revascularization Focused Update: J Am Coll Cardiol. [id number]. http://content.[email or web address]?[email or web address] Electronically Signed   By: Abigail Miyamoto M.D.   On: 07/07/2016 16:29     Labs:   Basic Metabolic Panel:  Recent Labs Lab 07/06/16 0348 07/07/16 0326  NA 141 139  K 3.9 3.6  CL 104 105  CO2 27 26  GLUCOSE 100* 94  BUN 9 10  CREATININE 0.77 0.83  CALCIUM 8.8* 8.4*   GFR Estimated Creatinine Clearance: 66.1 mL/min (by C-G formula based on SCr of 0.83 mg/dL). Liver Function Tests: No results for input(s): AST, ALT, ALKPHOS, BILITOT, PROT, ALBUMIN in the last 168 hours. No results for input(s): LIPASE, AMYLASE in the last 168 hours. No results for input(s): AMMONIA in the last 168 hours. Coagulation profile No results for input(s): INR, PROTIME in the last 168 hours.  CBC:  Recent Labs Lab 07/06/16 0348 07/07/16 0326  WBC 11.8* 8.4  HGB 12.6 12.6  HCT 39.5 38.7  MCV 84.9 83.9  PLT 293 280   Cardiac Enzymes:  Recent Labs Lab 07/06/16 1256 07/06/16 1745  TROPONINI <0.03 <0.03   BNP: Invalid input(s): POCBNP CBG: No results for input(s): GLUCAP in the last 168  hours. D-Dimer  Recent Labs  07/06/16 0348  DDIMER <0.27   Hgb A1c No results for input(s): HGBA1C in the last 72 hours. Lipid Profile  Recent Labs  07/07/16 0326  CHOL 185  HDL 54  LDLCALC 115*  TRIG 81  CHOLHDL 3.4   Thyroid function studies No results for input(s): TSH, T4TOTAL, T3FREE, THYROIDAB in the last 72 hours.  Invalid input(s): FREET3 Anemia work up No results for input(s): VITAMINB12, FOLATE, FERRITIN, TIBC, IRON, RETICCTPCT in the last 72 hours. Microbiology No results found for this or any previous visit (from the past 240 hour(s)).   Discharge Instructions:   Discharge Instructions    Call MD for:  extreme fatigue    Complete by:  As directed    Call MD for:  persistant nausea and vomiting    Complete by:  As directed    Call MD for:  severe uncontrolled pain    Complete by:  As directed    Diet - low sodium heart healthy    Complete by:  As directed    Increase activity slowly    Complete by:  As directed      Allergies as of 07/07/2016   No Known Allergies     Medication List    TAKE  these medications   aspirin 81 MG EC tablet Take 1 tablet (81 mg total) by mouth daily. Start taking on:  07/08/2016   atorvastatin 20 MG tablet Commonly known as:  LIPITOR Take 1 tablet (20 mg total) by mouth daily at 6 PM. Start taking on:  07/08/2016   guaiFENesin 100 MG/5ML liquid Commonly known as:  ROBITUSSIN Take 200 mg by mouth 3 (three) times daily as needed for cough.   metoprolol succinate 25 MG 24 hr tablet Commonly known as:  TOPROL XL Take 1 tablet (25 mg total) by mouth daily. Take with or immediately following a meal.   ondansetron 4 MG disintegrating tablet Commonly known as:  ZOFRAN ODT Take 1 tablet (4 mg total) by mouth every 8 (eight) hours as needed for nausea or vomiting.      Follow-up Information    Jennye Moccasin, MD. Schedule an appointment as soon as possible for a visit in 6 week(s).   Specialty:  Family  Medicine Why:  To recheck liver function tests while on Lipitor. Contact information: [address] [place] [address] [phone]            Time coordinating discharge: 25 minutes.  Signed:  Tajah Noguchi  Pager [phone] [facility] 07/07/2016, 5:17 PM

## 2016-07-07 NOTE — Progress Notes (Signed)
Progress Note    Brandi Moon  [id number] DOB: [birth year]  DOA: 07/06/2016 PCP: Jennye Moccasin, MD    Brief Narrative:   Chief complaint: Follow-up chest pain  Brandi Moon is an 71 y.o. female with a PMH of uterine cancer and a family history of heart disease who was admitted 07/06/16 for evaluation of chest pain. Upon initial evaluation in the ED, EKG was without ischemic changes. Heart score was 4. Initial troponin negative.  Assessment/Plan:   Principal Problem:   Chest pain Chest x-ray unremarkable. Troponins remain negative. Repeat EKG this morning shows normal sinus rhythm at 62 bpm and no ischemic changes (personally reviewed). Continue aspirin. Stress test planned. LDL elevated at 115, so will start statin. Continue morphine as needed for pain control. Continue GI cocktail as needed for indigestion.  Active Problems:   Hyperlipidemia Lipitor started.    Anxiety Supportive care.    Hypertension Continue hydralazine as needed.    Leukocytosis Resolved.  Body mass index is 31.76 kg/m.   Family Communication/Anticipated D/C date and plan/Code Status   DVT prophylaxis: Lovenox ordered. Code Status: Full Code.  Family Communication: Daughter at the bedside. Disposition Plan: Home if stress test negative.   Medical Consultants:    None.   Procedures:    None  Anti-Infectives:    None  Subjective:   No current complaints of chest pain, gets short of breath with episodes of chest pain.  Occasional non-productive cough.  Has been going on for the past month.  Says she has been under some stress. Reports foul smelling urine.  Objective:    Vitals:   07/06/16 1545 07/06/16 1723 07/06/16 2034 07/07/16 0638  BP: 133/73 (!) 156/61 (!) 131/54 (!) 150/59  Pulse: (!) 59 61 70 63  Resp: 17 18 18 15   Temp:  98.1 F (36.7 C) 98.9 F (37.2 C) 98 F (36.7 C)  TempSrc:  Oral Oral Oral  SpO2: 97% 100% 99% 100%  Weight:      Height:        No intake or output data in the 24 hours ending 07/07/16 0805 Filed Weights   07/06/16 0347  Weight: 83.9 kg (185 lb)    Exam: General exam: Appears calm and comfortable. Does not speak [place]. Respiratory system: Clear to auscultation. Respiratory effort normal. Cardiovascular system: S1 & S2 heard, RRR. No JVD,  rubs, gallops or clicks. No murmurs. Gastrointestinal system: Abdomen is nondistended, soft and nontender. No organomegaly or masses felt. Normal bowel sounds heard. Central nervous system: Alert and oriented. No focal neurological deficits. Extremities: No clubbing,  or cyanosis. No edema. Skin: No rashes, lesions or ulcers. Psychiatry: Judgement and insight appear normal. Mood & affect appropriate.   Data Reviewed:   I have personally reviewed following labs and imaging studies:  Labs: Basic Metabolic Panel:  Recent Labs Lab 07/06/16 0348 07/07/16 0326  NA 141 139  K 3.9 3.6  CL 104 105  CO2 27 26  GLUCOSE 100* 94  BUN 9 10  CREATININE 0.77 0.83  CALCIUM 8.8* 8.4*   GFR Estimated Creatinine Clearance: 66.1 mL/min (by C-G formula based on SCr of 0.83 mg/dL). Liver Function Tests: No results for input(s): AST, ALT, ALKPHOS, BILITOT, PROT, ALBUMIN in the last 168 hours. No results for input(s): LIPASE, AMYLASE in the last 168 hours. No results for input(s): AMMONIA in the last 168 hours. Coagulation profile No results for input(s): INR, PROTIME in the last 168 hours.  CBC:  Recent Labs Lab 07/06/16 0348 07/07/16 0326  WBC 11.8* 8.4  HGB 12.6 12.6  HCT 39.5 38.7  MCV 84.9 83.9  PLT 293 280   Cardiac Enzymes:  Recent Labs Lab 07/06/16 1256 07/06/16 1745  TROPONINI <0.03 <0.03   BNP (last 3 results) No results for input(s): PROBNP in the last 8760 hours. CBG: No results for input(s): GLUCAP in the last 168 hours. D-Dimer:  Recent Labs  07/06/16 0348  DDIMER <0.27   Hgb A1c: No results for input(s): HGBA1C in the last 72  hours. Lipid Profile:  Recent Labs  07/07/16 0326  CHOL 185  HDL 54  LDLCALC 115*  TRIG 81  CHOLHDL 3.4   Thyroid function studies: No results for input(s): TSH, T4TOTAL, T3FREE, THYROIDAB in the last 72 hours.  Invalid input(s): FREET3 Anemia work up: No results for input(s): VITAMINB12, FOLATE, FERRITIN, TIBC, IRON, RETICCTPCT in the last 72 hours. Sepsis Labs:  Recent Labs Lab 07/06/16 0348 07/07/16 0326  WBC 11.8* 8.4    Microbiology No results found for this or any previous visit (from the past 240 hour(s)).  Radiology: Dg Chest 2 View  Result Date: 07/06/2016 CLINICAL DATA:  71 y/o  F; chest pain and shortness of breath EXAM: CHEST  2 VIEW COMPARISON:  08/07/2014 chest radiograph FINDINGS: Stable heart size and mediastinal contours are within normal limits. Both lungs are clear. The visualized skeletal structures are unremarkable. Aortic atherosclerosis with calcification. IMPRESSION: No active cardiopulmonary disease. Electronically Signed   By: Kristine Garbe M.D.   On: 07/06/2016 04:16    Medications:   . aspirin EC  81 mg Oral Daily  . enoxaparin (LOVENOX) injection  40 mg Subcutaneous Q24H   Continuous Infusions:  Medical decision making is of high complexity and this patient is at high risk of deterioration, therefore this is a level 3 visit.  (> 4 problem points, 2 data points, high risk)   LOS: 0 days   [name],[name]  [facility] Pager [phone]. If unable to reach me by pager, please call my cell phone at [phone].  *Please refer to [email or web address], password [email or web address] to get updated schedule on who will round on this patient, as hospitalists switch teams weekly. If 7PM-7AM, please contact night-coverage at [email or web address], password [email or web address] for any overnight needs.  07/07/2016, 8:05 AM

## 2016-07-07 NOTE — Discharge Instructions (Signed)
Dolor en la pared torácica  (Chest Wall Pain)  El dolor en la pared torácica se produce en los huesos y los músculos del pecho o alrededor de estos. A veces, una lesión causa este dolor. En ocasiones, la causa puede ser desconocida. Este dolor puede durar varias semanas.  INSTRUCCIONES PARA EL CUIDADO EN EL HOGAR  Esté atento a cualquier cambio en los síntomas. Tome estas medidas para aliviar el dolor:  · Haga reposo como se lo haya indicado el médico.  · Evite las actividades que causan dolor. Estas pueden ser aquellas que requieren el uso de los músculos del tórax, los abdominales o los laterales para levantar objetos pesados.  · Si se lo indican, aplique hielo sobre la zona dolorida:  ? Ponga el hielo en una bolsa plástica.  ? Coloque una toalla entre la piel y la bolsa de hielo.  ? Coloque el hielo durante 20 minutos, 2 a 3 veces por día.  · Tome los medicamentos de venta libre y los recetados solamente como se lo haya indicado el médico.  · No consuma productos que contengan tabaco, incluidos cigarrillos, tabaco de mascar y cigarrillos electrónicos. Si necesita ayuda para dejar de fumar, consulte al médico.  · Concurra a todas las visitas de control como se lo haya indicado el médico. Esto es importante.  SOLICITE ATENCIÓN MÉDICA SI:  · Tiene fiebre.  · El dolor de pecho empeora.  · Aparecen nuevos síntomas.    SOLICITE ATENCIÓN MÉDICA DE INMEDIATO SI:  · Tiene náuseas o vómitos.  · Transpira o tiene sensación de desvanecimiento.  · Tiene tos con flema (esputo) o expectora sangre al toser.  · Le falta el aire.    Esta información no tiene como fin reemplazar el consejo del médico. Asegúrese de hacerle al médico cualquier pregunta que tenga.  Document Released: 06/27/2006 Document Revised: 02/04/2015 Document Reviewed: 08/11/2014  Elsevier Interactive Patient Education © 2017 [employer]vier Inc.

## 2016-07-07 NOTE — Progress Notes (Signed)
     The patient was seen in nuclear medicine for a lexiscan myoview. She tolerated the procedure well. No acute ST or TW changes on ECG. Await nuclear images.    Taisei Bonnette Stern PA-C  [facility]     

## 2016-07-10 LAB — URINE CULTURE

## 2017-11-09 ENCOUNTER — Ambulatory Visit (INDEPENDENT_AMBULATORY_CARE_PROVIDER_SITE_OTHER): Payer: Medicare Other | Admitting: Family Medicine

## 2017-11-09 ENCOUNTER — Other Ambulatory Visit: Payer: Self-pay

## 2017-11-09 ENCOUNTER — Encounter: Payer: Self-pay | Admitting: Family Medicine

## 2017-11-09 VITALS — BP 128/78 | HR 74 | Temp 98.8°F | Ht 65.2 in | Wt 174.6 lb

## 2017-11-09 DIAGNOSIS — R079 Chest pain, unspecified: Secondary | ICD-10-CM | POA: Diagnosis not present

## 2017-11-09 DIAGNOSIS — R252 Cramp and spasm: Secondary | ICD-10-CM | POA: Diagnosis not present

## 2017-11-09 DIAGNOSIS — F411 Generalized anxiety disorder: Secondary | ICD-10-CM

## 2017-11-09 DIAGNOSIS — E785 Hyperlipidemia, unspecified: Secondary | ICD-10-CM

## 2017-11-09 NOTE — Progress Notes (Signed)
6/13/20195:18 PM  Brandi Moon 01-19-1946, 72 y.o. female [id number]  Chief Complaint  Patient presents with  . Chest Pain    pressure comes and goes for a wk, Went to ER 28mos ago did not follow up for  not having PCP.  Marland Kitchen Pain    having spasms in the hand and the back of the legs    HPI:   Patient is a 72 y.o. female with past medical history significant for anxiety and chest pain who presents today with several concerns:  Patient reports long standing intermittent episodes of chest pain  She describes the chest pain as an anguish with mild SOB during times of worries or stress She states this sensation can last days She tends to worry over her son She is very active, cleans house daily, without any CP, DOE, diaphoresis, nausea, palpitations Diaphoresis, nausea, palpitations, radiation of pain is also absent during her chest pain episodes She took statin for a month, has continued on BB and requesting refill as her blood pressure was a bit high at the ER. Denies any side effects.  Chart review: ER 06/2016 - neg workup started statin (LDL 115) and BB NM myoview IMPRESSION: 1. No evidence of reversible ischemia. Mildly decreased rest uptake in the septal wall may represent scar. 2. Hypokinesis involving the septal wall. 3. Left ventricular ejection fraction 55 % 4. 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 No     Depression screen PHQ 2/9 11/09/2017  Decreased Interest 0  Down, Depressed, Hopeless 0  PHQ - 2 Score 0    No Known Allergies  Prior to Admission medications   Medication Sig Start Date End Date Taking? Authorizing Provider  aspirin EC 81 MG EC tablet Take 1 tablet (81 mg total) by mouth daily. 07/08/16  Yes [name],  Venetia Maxon, MD  metoprolol succinate (TOPROL-XL) 25 MG 24 hr tablet Take 1 tablet (25 mg total) by mouth daily. Take with or immediately following a meal. 07/07/16  Yes [name], Venetia Maxon, MD  atorvastatin (LIPITOR) 20 MG tablet Take 1 tablet (20 mg total) by mouth daily at 6 PM. Patient not taking: Reported on 11/09/2017 07/08/16   [name], Venetia Maxon, MD    Past Medical History:  Diagnosis Date  . Arthritis   . Cancer [facility])    uterine    Past Surgical History:  Procedure Laterality Date  . histerectomy      Social History   Tobacco Use  . Smoking status: Never Smoker  . Smokeless tobacco: Never Used  Substance Use Topics  . Alcohol use: No    Family History  Problem Relation Age of Onset  . COPD Mother   . Heart attack Father     Review of Systems  Constitutional: Negative for chills and fever.  Respiratory: Positive for shortness of breath. Negative for cough.   Cardiovascular: Positive for chest pain. Negative for palpitations, orthopnea, leg swelling and PND.  Gastrointestinal: Negative for abdominal pain, blood in stool, constipation, diarrhea, melena, nausea and vomiting.  Genitourinary: Positive for frequency. Negative for dysuria and hematuria.  Musculoskeletal: Positive for joint pain.  Neurological: Negative for dizziness, focal weakness and headaches.  Psychiatric/Behavioral: Negative for depression. The patient is  nervous/anxious.      OBJECTIVE:  Blood pressure 128/78, pulse 74, temperature 98.8 F (37.1 C), temperature source Oral, height 5' 5.2" (1.656 m), weight 174 lb 9.6 oz (79.2 kg), SpO2 100 %.  Physical Exam  Constitutional: She is oriented to person, place, and time. She appears well-developed and well-nourished.  HENT:  Head: Normocephalic and atraumatic.  Mouth/Throat: Oropharynx is clear and moist. No oropharyngeal exudate.  Eyes: Pupils are equal, round, and reactive to light. EOM are normal. No scleral icterus.  Neck: Neck supple.    Cardiovascular: Normal rate, regular rhythm and normal heart sounds. Exam reveals no gallop and no friction rub.  No murmur heard. Pulmonary/Chest: Effort normal and breath sounds normal. She has no wheezes. She has no rales.  Musculoskeletal: She exhibits no edema.  Neurological: She is alert and oriented to person, place, and time.  Skin: Skin is warm and dry.  Nursing note and vitals reviewed.   My interpretation of EKG:  NSR, HR 62, normal intervals, no st changes  ASSESSMENT and PLAN 1. Chest pain, unspecified type - EKG 12- lead On going with low risk stratification in ER in 2018. Setting always in setting of anxiety. She otherwise reports good cardiac endurance and has no risk factors for CAD. She does not want to pursue at this time. ER precautions reviewed.  2. Anxiety state Patient copes with supportive measures. No treatment desired.  - TSH  3. Muscle cramps Checking electrolytes, discussed pushing fluids and gentle stretching  4. Hyperlipidemia, unspecified hyperlipidemia type LDL 115 a year ago,currently off statin, checking today - Comprehensive metabolic panel - Lipid panel  Return in about 6 months (around 05/11/2018).    Rutherford Guys, MD [facility] [place] [place], [place] [address] Ph.  [phone] Fax [phone]

## 2017-11-09 NOTE — Patient Instructions (Signed)
     IF you received an x-ray today, you will receive an invoice from [facility]. Please contact [facility] at [phone] with questions or concerns regarding your invoice.   IF you received labwork today, you will receive an invoice from [facility]. Please contact [facility] at [phone] with questions or concerns regarding your invoice.   Our billing staff will not be able to assist you with questions regarding bills from these companies.  You will be contacted with the lab results as soon as they are available. The fastest way to get your results is to activate your My Chart account. Instructions are located on the last page of this paperwork. If you have not heard from us regarding the results in 2 weeks, please contact this office.     

## 2017-11-10 LAB — COMPREHENSIVE METABOLIC PANEL
ALT: 14 IU/L (ref 0–32)
AST: 17 IU/L (ref 0–40)
Albumin/Globulin Ratio: 1.3 (ref 1.2–2.2)
Albumin: 4 g/dL (ref 3.5–4.8)
Alkaline Phosphatase: 95 IU/L (ref 39–117)
BUN/Creatinine Ratio: 16 (ref 12–28)
BUN: 12 mg/dL (ref 8–27)
Bilirubin Total: 0.4 mg/dL (ref 0.0–1.2)
CO2: 23 mmol/L (ref 20–29)
Calcium: 9.2 mg/dL (ref 8.7–10.3)
Chloride: 106 mmol/L (ref 96–106)
Creatinine, Ser: 0.75 mg/dL (ref 0.57–1.00)
GFR calc Af Amer: 93 mL/min/{1.73_m2} (ref >59)
GFR calc non Af Amer: 80 mL/min/{1.73_m2} (ref >59)
Globulin, Total: 3.2 g/dL (ref 1.5–4.5)
Glucose: 85 mg/dL (ref 65–99)
Potassium: 4.9 mmol/L (ref 3.5–5.2)
Sodium: 144 mmol/L (ref 134–144)
Total Protein: 7.2 g/dL (ref 6.0–8.5)

## 2017-11-10 LAB — LIPID PANEL
Chol/HDL Ratio: 3.1 ratio (ref 0.0–4.4)
Cholesterol, Total: 240 mg/dL — ABNORMAL HIGH (ref 100–199)
HDL: 77 mg/dL (ref >39)
LDL Calculated: 145 mg/dL — ABNORMAL HIGH (ref 0–99)
Triglycerides: 91 mg/dL (ref 0–149)
VLDL Cholesterol Cal: 18 mg/dL (ref 5–40)

## 2017-11-10 LAB — TSH: TSH: 2.78 u[IU]/mL (ref 0.450–4.500)

## 2018-01-03 ENCOUNTER — Encounter (INDEPENDENT_AMBULATORY_CARE_PROVIDER_SITE_OTHER): Payer: Self-pay

## 2018-01-12 ENCOUNTER — Telehealth: Payer: Self-pay | Admitting: Family Medicine

## 2018-01-12 NOTE — Telephone Encounter (Signed)
Called patient in regards to her appt she has with Dr. Pamella Pert on 05/17/2018. The provide will not be in the office on that day and will need to be rescheduled. Did not leave a message because no dpr was on file.

## 2018-01-17 IMAGING — DX DG CHEST 2V
2 series · 2 of 2 positions shown · non-contrast
Comparison: 08/07/2014 chest radiograph

CLINICAL DATA: 70 y/o  F; chest pain and shortness of breath

EXAM:
CHEST  2 VIEW

[chest pa]
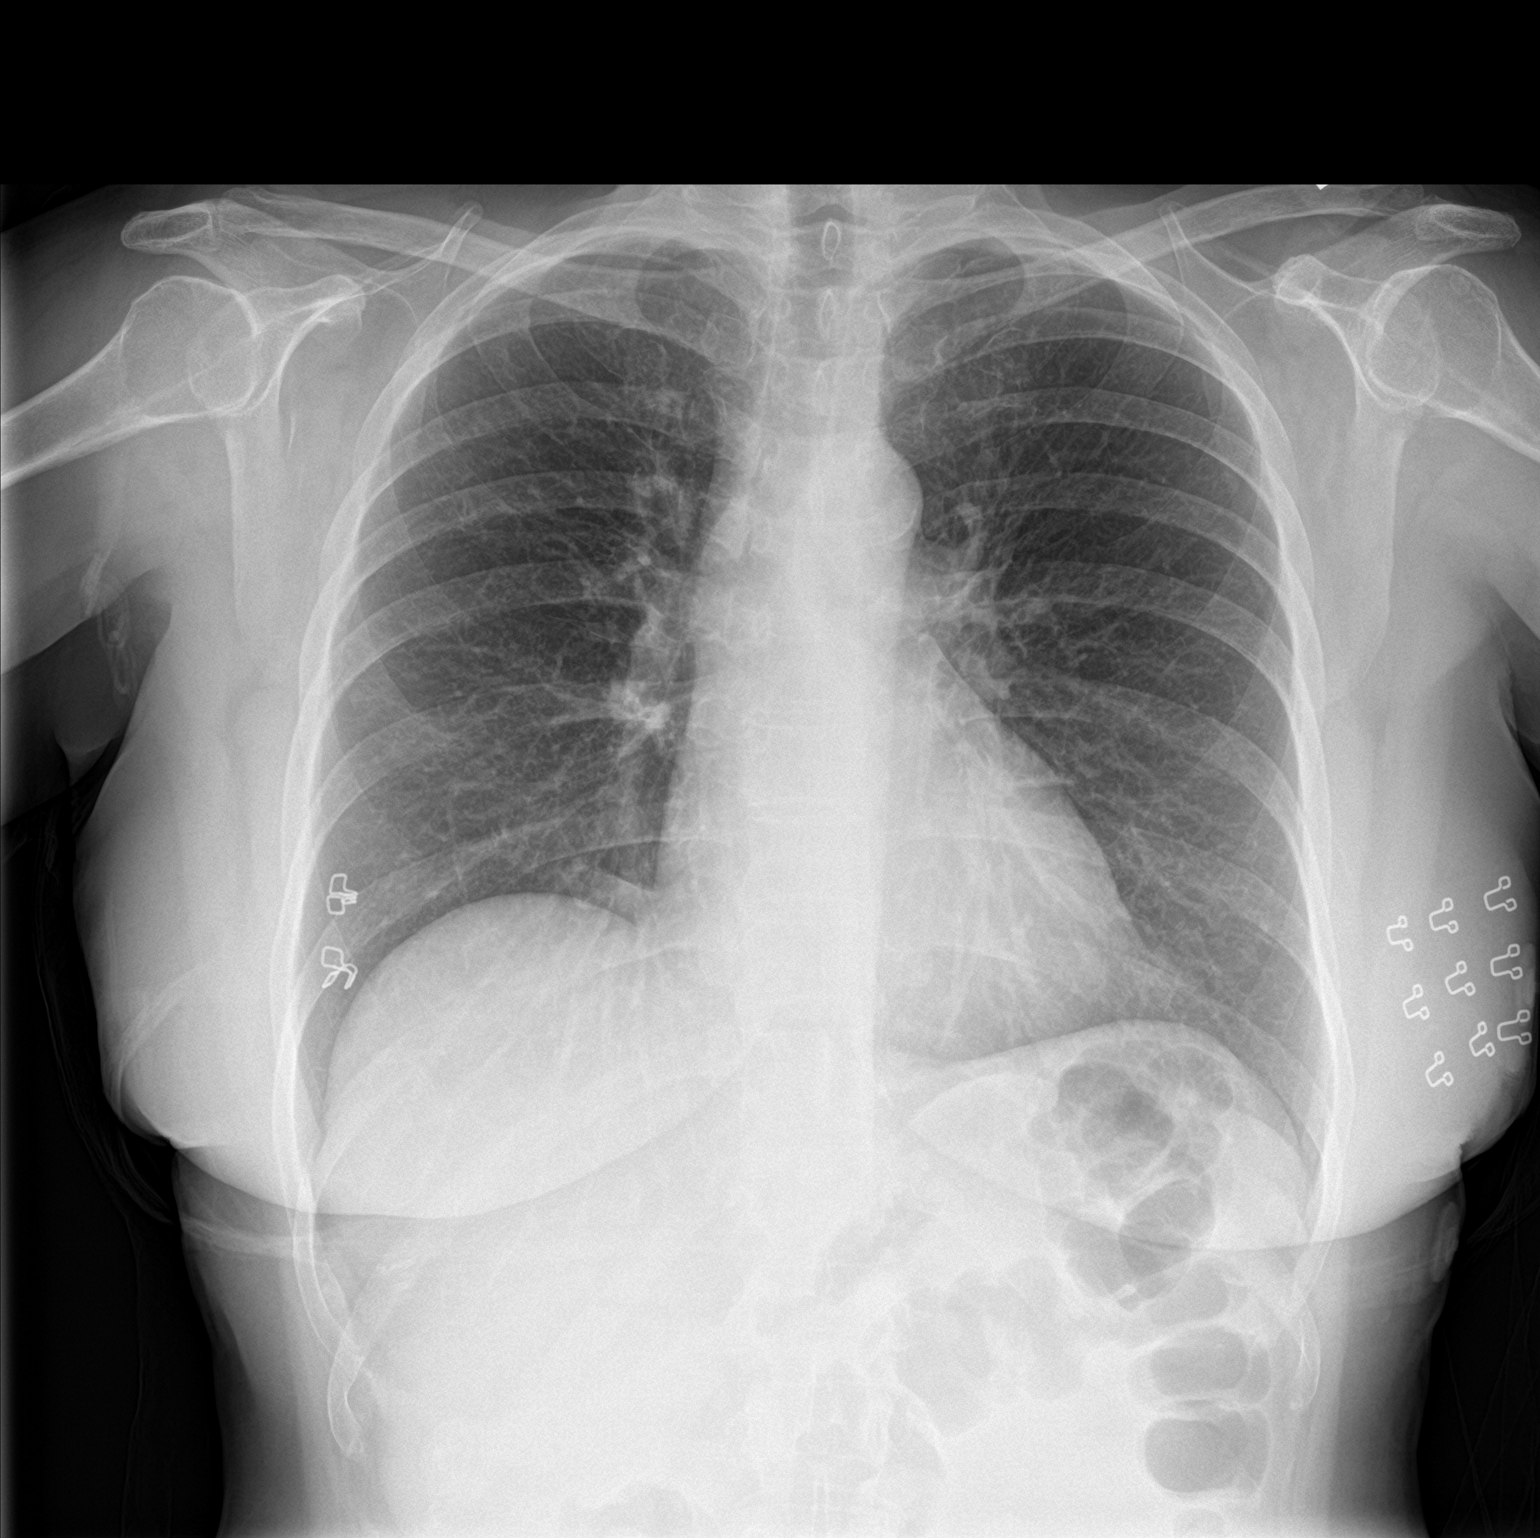

[chest lat]
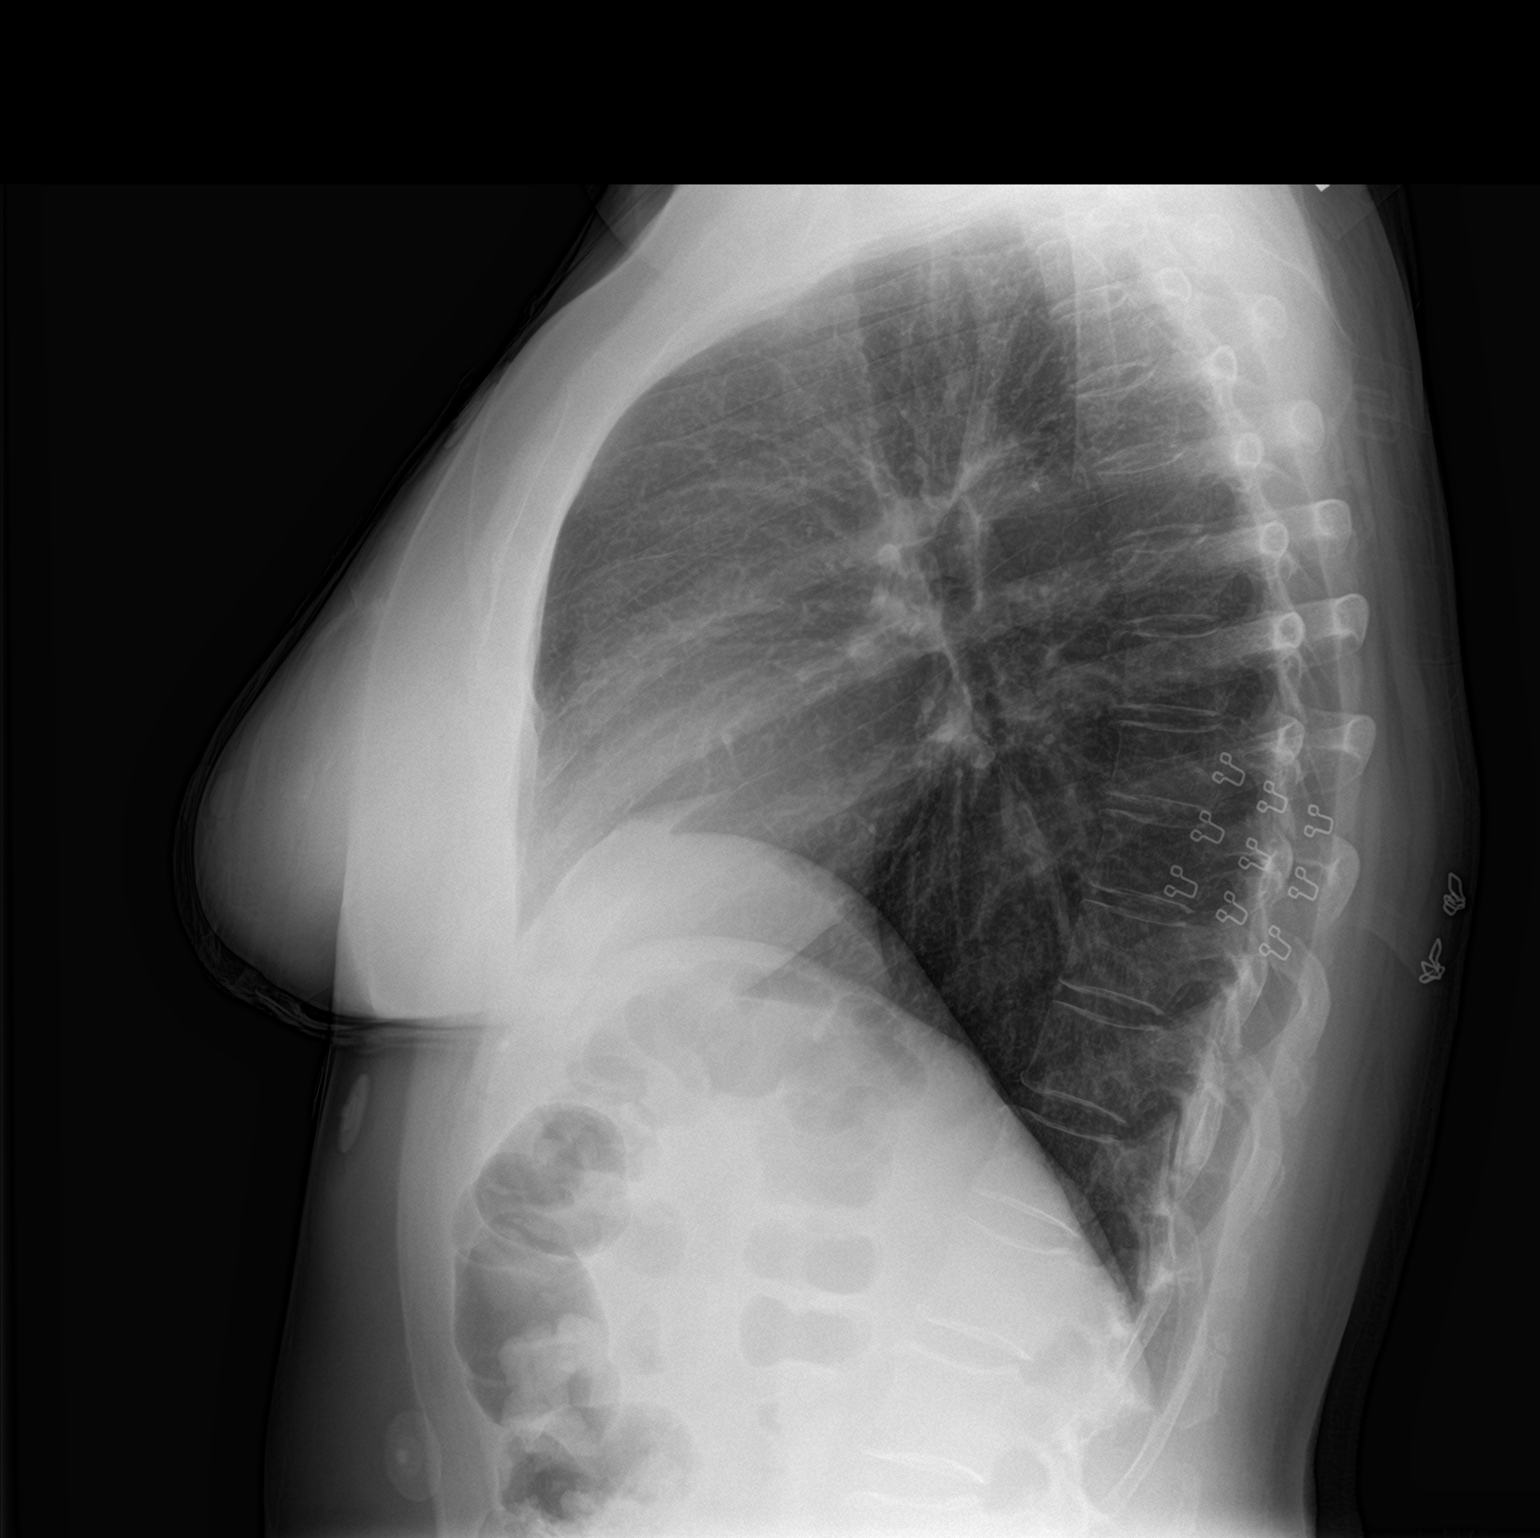

[2 of 2 positions shown; findings below may reference images not displayed]

FINDINGS: Stable heart size and mediastinal contours are within normal limits.
Both lungs are clear. The visualized skeletal structures are
unremarkable. Aortic atherosclerosis with calcification.
IMPRESSION: No active cardiopulmonary disease.

By: Fany Bruns M.D.

## 2018-05-17 ENCOUNTER — Ambulatory Visit: Payer: Medicare Other | Admitting: Family Medicine

## 2018-10-08 ENCOUNTER — Emergency Department (HOSPITAL_BASED_OUTPATIENT_CLINIC_OR_DEPARTMENT_OTHER): Payer: Medicare Other

## 2018-10-08 ENCOUNTER — Emergency Department (HOSPITAL_BASED_OUTPATIENT_CLINIC_OR_DEPARTMENT_OTHER)
Admission: EM | Admit: 2018-10-08 | Discharge: 2018-10-08 | Disposition: A | Discharge status: Home / Self Care | Payer: Medicare Other | Attending: Emergency Medicine | Admitting: Emergency Medicine

## 2018-10-08 ENCOUNTER — Encounter (HOSPITAL_BASED_OUTPATIENT_CLINIC_OR_DEPARTMENT_OTHER): Payer: Self-pay | Admitting: *Deleted

## 2018-10-08 ENCOUNTER — Other Ambulatory Visit: Payer: Self-pay

## 2018-10-08 DIAGNOSIS — R2241 Localized swelling, mass and lump, right lower limb: Secondary | ICD-10-CM | POA: Diagnosis not present

## 2018-10-08 DIAGNOSIS — M79661 Pain in right lower leg: Secondary | ICD-10-CM | POA: Diagnosis not present

## 2018-10-08 DIAGNOSIS — Z79899 Other long term (current) drug therapy: Secondary | ICD-10-CM | POA: Diagnosis not present

## 2018-10-08 DIAGNOSIS — Z7982 Long term (current) use of aspirin: Secondary | ICD-10-CM | POA: Insufficient documentation

## 2018-10-08 DIAGNOSIS — M7989 Other specified soft tissue disorders: Secondary | ICD-10-CM | POA: Diagnosis not present

## 2018-10-08 DIAGNOSIS — I10 Essential (primary) hypertension: Secondary | ICD-10-CM | POA: Diagnosis not present

## 2018-10-08 NOTE — ED Provider Notes (Signed)
[facility] EMERGENCY DEPARTMENT Provider Note   CSN: [id number] Arrival date & time: 10/08/18  2112    History   Chief Complaint Chief Complaint  Patient presents with  . Leg Swelling    HPI Brandi Moon is a 73 y.o. female.     73yo F w/ PMH including uterine cancer s/p hysterectomy, arthritis who p/w leg swelling. Pt reports 1 month of persistent R lower leg swelling and pain. No h/o trauma. She has tried compression stocking without relief. Sx not progressive but not improving. She denies any associated fever, chest pain, SOB, or recent illness. No recent travel or h/o blood clots. She notes that this happened a long time ago in [place] and she thinks that at that time they did an [place] that was negative for clot.   The history is provided by the patient and a relative.    Past Medical History:  Diagnosis Date  . Arthritis   . Cancer [facility])    uterine    Patient Active Problem List   Diagnosis Date Noted  . Hyperlipidemia 07/07/2016  . Chest pain 07/06/2016  . Anxiety 07/06/2016  . Hypertension 07/06/2016  . Leukocytosis 07/06/2016    Past Surgical History:  Procedure Laterality Date  . ABDOMINAL HYSTERECTOMY    . histerectomy       OB History   No obstetric history on file.      Home Medications    Prior to Admission medications   Medication Sig Start Date End Date Taking? Authorizing Provider  aspirin EC 81 MG EC tablet Take 1 tablet (81 mg total) by mouth daily. 07/08/16   [name], Venetia Maxon, MD  atorvastatin (LIPITOR) 20 MG tablet Take 1 tablet (20 mg total) by mouth daily at 6 PM. Patient not taking: Reported on 11/09/2017 07/08/16   [name], Venetia Maxon, MD  metoprolol succinate (TOPROL-XL) 25 MG 24 hr tablet Take 1 tablet (25 mg total) by mouth daily. Take with or immediately following a meal. 07/07/16   [name], Venetia Maxon, MD    Family History Family History  Problem Relation Age of Onset  . COPD Mother   . Heart attack Father      Social History Social History   Tobacco Use  . Smoking status: Never Smoker  . Smokeless tobacco: Never Used  Substance Use Topics  . Alcohol use: No  . Drug use: No     Allergies   Patient has no known allergies.   Review of Systems Review of Systems All other systems reviewed and are negative except that which was mentioned in HPI   Physical Exam Updated Vital Signs BP (!) 172/78   Pulse 78   Temp 97.7 F (36.5 C) (Oral)   Resp 18   Ht 5\' 4"  (1.626 m)   Wt 77.1 kg   SpO2 100%   BMI 29.18 kg/m   Physical Exam Vitals signs and nursing note reviewed.  Constitutional:      General: She is not in acute distress.    Appearance: She is well-developed.  HENT:     Head: Normocephalic and atraumatic.  Eyes:     Conjunctiva/sclera: Conjunctivae normal.  Neck:     Musculoskeletal: Neck supple.  Cardiovascular:     Rate and Rhythm: Normal rate and regular rhythm.     Pulses: Normal pulses.     Heart sounds: Normal heart sounds. No murmur.  Pulmonary:     Effort: Pulmonary effort is normal.  Breath sounds: Normal breath sounds.  Abdominal:     General: Bowel sounds are normal. There is no distension.     Palpations: Abdomen is soft.     Tenderness: There is no abdominal tenderness.  Musculoskeletal:        General: Swelling present.     Comments: Edema of R lower leg from foot to knee, no associated warmth or redness; compartments soft  Skin:    General: Skin is warm and dry.     Findings: No erythema or rash.  Neurological:     Mental Status: She is alert and oriented to person, place, and time.     Comments: Fluent speech Normal sensation BLE  Psychiatric:        Judgment: Judgment normal.      ED Treatments / Results  Labs (all labs ordered are listed, but only abnormal results are displayed) Labs Reviewed - No data to display  EKG None  Radiology US Venous Img Lower Right (dvt Study)  Result Date: 10/08/2018 CLINICAL DATA:  Right lower  extremity swelling for 1 month EXAM: Right LOWER EXTREMITY VENOUS DOPPLER ULTRASOUND TECHNIQUE: Gray-scale sonography with graded compression, as well as color Doppler and duplex ultrasound were performed to evaluate the lower extremity deep venous systems from the level of the common femoral vein and including the common femoral, femoral, profunda femoral, popliteal and calf veins including the posterior tibial, peroneal and gastrocnemius veins when visible. The superficial great saphenous vein was also interrogated. Spectral Doppler was utilized to evaluate flow at rest and with distal augmentation maneuvers in the common femoral, femoral and popliteal veins. COMPARISON:  None. FINDINGS: Contralateral Common Femoral Vein: Respiratory phasicity is normal and symmetric with the symptomatic side. No evidence of thrombus. Normal compressibility. Common Femoral Vein: No evidence of thrombus. Normal compressibility, respiratory phasicity and response to augmentation. Saphenofemoral Junction: No evidence of thrombus. Normal compressibility and flow on color Doppler imaging. Profunda Femoral Vein: No evidence of thrombus. Normal compressibility and flow on color Doppler imaging. Femoral Vein: No evidence of thrombus. Normal compressibility, respiratory phasicity and response to augmentation. Popliteal Vein: No evidence of thrombus. Normal compressibility, respiratory phasicity and response to augmentation. Calf Veins: No evidence of thrombus. Normal compressibility and flow on color Doppler imaging. Superficial Great Saphenous Vein: No evidence of thrombus. Normal compressibility. Venous Reflux:  None. Other Findings:  None. IMPRESSION: No evidence of deep venous thrombosis. Electronically Signed   By: Ulyses Jarred M.D.   On: 10/08/2018 22:29    Procedures Procedures (including critical care time)  Medications Ordered in ED Medications - No data to display   Initial Impression / Assessment and Plan / ED Course   I have reviewed the triage vital signs and the nursing notes.  Pertinent imaging results that were available during my care of the patient were reviewed by me and considered in my medical decision making (see chart for details).       Neurovascularly intact on exam w/ normal distal pulses. Given 1 month duration of symptoms and no warmth or erythema on exam, I highly doubt infection. Obtained US to r/o DVT. [place] negative. Given that she has had swelling in this leg before, I suspect venous insufficiency. Discussed importance of compression stockings, elevation to help w/ edema.  Acted to follow-up with PCP if symptoms worsen despite supportive measures.  Reviewed return precautions regarding any signs of vascular compromise or infection.  She and family voiced understanding.  Final Clinical Impressions(s) / ED Diagnoses  Final diagnoses:  Pain and swelling of right lower leg    ED Discharge Orders    None       Little, Wenda Overland, MD 10/08/18 2246

## 2018-10-08 NOTE — ED Triage Notes (Signed)
Pt c/o right leg swelling and pain x 1 month

## 2018-10-08 NOTE — ED Notes (Signed)
Patient returned from US.

## 2018-10-08 NOTE — ED Notes (Signed)
Patient transported to Ultrasound 

## 2018-10-08 NOTE — ED Notes (Signed)
ED Provider at bedside. 

## 2019-12-18 DIAGNOSIS — M7731 Calcaneal spur, right foot: Secondary | ICD-10-CM | POA: Diagnosis not present

## 2019-12-18 DIAGNOSIS — M7732 Calcaneal spur, left foot: Secondary | ICD-10-CM | POA: Diagnosis not present

## 2019-12-18 DIAGNOSIS — G609 Hereditary and idiopathic neuropathy, unspecified: Secondary | ICD-10-CM | POA: Diagnosis not present

## 2019-12-18 DIAGNOSIS — M722 Plantar fascial fibromatosis: Secondary | ICD-10-CM | POA: Diagnosis not present

## 2019-12-19 DIAGNOSIS — G9009 Other idiopathic peripheral autonomic neuropathy: Secondary | ICD-10-CM | POA: Diagnosis not present

## 2019-12-25 DIAGNOSIS — M7731 Calcaneal spur, right foot: Secondary | ICD-10-CM | POA: Diagnosis not present

## 2019-12-25 DIAGNOSIS — M7732 Calcaneal spur, left foot: Secondary | ICD-10-CM | POA: Diagnosis not present

## 2019-12-25 DIAGNOSIS — G609 Hereditary and idiopathic neuropathy, unspecified: Secondary | ICD-10-CM | POA: Diagnosis not present

## 2019-12-25 DIAGNOSIS — M722 Plantar fascial fibromatosis: Secondary | ICD-10-CM | POA: Diagnosis not present

## 2020-01-22 DIAGNOSIS — G609 Hereditary and idiopathic neuropathy, unspecified: Secondary | ICD-10-CM | POA: Diagnosis not present

## 2020-01-22 DIAGNOSIS — M722 Plantar fascial fibromatosis: Secondary | ICD-10-CM | POA: Diagnosis not present

## 2020-01-22 DIAGNOSIS — M7731 Calcaneal spur, right foot: Secondary | ICD-10-CM | POA: Diagnosis not present

## 2020-01-22 DIAGNOSIS — M7732 Calcaneal spur, left foot: Secondary | ICD-10-CM | POA: Diagnosis not present

## 2020-02-12 DIAGNOSIS — G609 Hereditary and idiopathic neuropathy, unspecified: Secondary | ICD-10-CM | POA: Diagnosis not present

## 2020-02-12 DIAGNOSIS — M7731 Calcaneal spur, right foot: Secondary | ICD-10-CM | POA: Diagnosis not present

## 2020-02-12 DIAGNOSIS — M7732 Calcaneal spur, left foot: Secondary | ICD-10-CM | POA: Diagnosis not present

## 2020-02-12 DIAGNOSIS — M722 Plantar fascial fibromatosis: Secondary | ICD-10-CM | POA: Diagnosis not present

## 2020-03-25 ENCOUNTER — Encounter (HOSPITAL_BASED_OUTPATIENT_CLINIC_OR_DEPARTMENT_OTHER): Payer: Self-pay | Admitting: *Deleted

## 2020-03-25 ENCOUNTER — Other Ambulatory Visit: Payer: Self-pay

## 2020-03-25 ENCOUNTER — Emergency Department (HOSPITAL_BASED_OUTPATIENT_CLINIC_OR_DEPARTMENT_OTHER)
Admission: EM | Admit: 2020-03-25 | Discharge: 2020-03-25 | Disposition: A | Discharge status: Home / Self Care | Payer: Medicare Other | Attending: Emergency Medicine | Admitting: Emergency Medicine

## 2020-03-25 DIAGNOSIS — M791 Myalgia, unspecified site: Secondary | ICD-10-CM | POA: Insufficient documentation

## 2020-03-25 DIAGNOSIS — R0981 Nasal congestion: Secondary | ICD-10-CM | POA: Insufficient documentation

## 2020-03-25 DIAGNOSIS — R0602 Shortness of breath: Secondary | ICD-10-CM | POA: Insufficient documentation

## 2020-03-25 DIAGNOSIS — Z7982 Long term (current) use of aspirin: Secondary | ICD-10-CM | POA: Insufficient documentation

## 2020-03-25 DIAGNOSIS — Z8542 Personal history of malignant neoplasm of other parts of uterus: Secondary | ICD-10-CM | POA: Insufficient documentation

## 2020-03-25 DIAGNOSIS — Z20822 Contact with and (suspected) exposure to covid-19: Secondary | ICD-10-CM | POA: Diagnosis not present

## 2020-03-25 DIAGNOSIS — R059 Cough, unspecified: Secondary | ICD-10-CM | POA: Diagnosis not present

## 2020-03-25 LAB — RESPIRATORY PANEL BY RT PCR (FLU A&B, COVID)
Influenza A by PCR: NEGATIVE
Influenza B by PCR: NEGATIVE
SARS Coronavirus 2 by RT PCR: NEGATIVE

## 2020-03-25 NOTE — ED Provider Notes (Signed)
[facility] DEPT [facility] Provider Note: Georgena Spurling, MD, FACEP  CSN: [id number] MRN: [id number] ARRIVAL: 03/25/20 at [place]: [place]  Covid Exposure   HISTORY OF PRESENT ILLNESS  03/25/20 12:22 AM Brandi Moon is a 74 y.o. female with a 1 week history of Covid symptoms. Specifically she has had nasal congestion, body aches, cough and some mild shortness of breath early in the course but that has improved. She has not had vomiting or diarrhea. She has not had a fever. She has had Covid positive exposure. Her symptoms have been mild to moderate. She has been taking over-the-counter cold medications with partial relief.   Past Medical History:  Diagnosis Date  . Arthritis   . Cancer [facility])    uterine    Past Surgical History:  Procedure Laterality Date  . ABDOMINAL HYSTERECTOMY    . histerectomy      Family History  Problem Relation Age of Onset  . COPD Mother   . Heart attack Father     Social History   Tobacco Use  . Smoking status: Never Smoker  . Smokeless tobacco: Never Used  Substance Use Topics  . Alcohol use: No  . Drug use: No    Prior to Admission medications   Medication Sig Start Date End Date Taking? Authorizing Provider  aspirin EC 81 MG EC tablet Take 1 tablet (81 mg total) by mouth daily. 07/08/16   [name], Venetia Maxon, MD  metoprolol succinate (TOPROL-XL) 25 MG 24 hr tablet Take 1 tablet (25 mg total) by mouth daily. Take with or immediately following a meal. 07/07/16   [name], Venetia Maxon, MD  atorvastatin (LIPITOR) 20 MG tablet Take 1 tablet (20 mg total) by mouth daily at 6 PM. Patient not taking: Reported on 11/09/2017 07/08/16 03/25/20  [name], Venetia Maxon, MD    Allergies Patient has no known allergies.   REVIEW OF SYSTEMS  Negative except as noted here or in the History of Present Illness.   PHYSICAL EXAMINATION  Initial Vital Signs Blood pressure (!) 176/81, pulse 75, temperature 98.5 F (36.9 C), temperature  source Oral, resp. rate 18, height 5\' 4"  (1.626 m), weight 86.2 kg, SpO2 100 %.  Examination General: Well-developed, well-nourished female in no acute distress; appearance consistent with age of record HENT: normocephalic; atraumatic Eyes: pupils equal, round and reactive to light; extraocular muscles intact Neck: supple Heart: regular rate and rhythm Lungs: clear to auscultation bilaterally Abdomen: soft; nondistended; nontender; bowel sounds present Extremities: No deformity; full range of motion; pulses normal Neurologic: Awake, alert; motor function intact in all extremities and symmetric; no facial droop Skin: Warm and dry Psychiatric: Normal mood and affect   RESULTS  Summary of this visit's results, reviewed and interpreted by myself:   EKG Interpretation  Date/Time:    Ventricular Rate:    PR Interval:    QRS Duration:   QT Interval:    QTC Calculation:   R Axis:     Text Interpretation:        Laboratory Studies: Results for orders placed or performed during the hospital encounter of 03/25/20 (from the past 24 hour(s))  Respiratory Panel by RT PCR (Flu A&B, Covid) - Nasopharyngeal Swab     Status: None   Collection Time: 03/25/20 12:24 AM   Specimen: Nasopharyngeal Swab  Result Value Ref Range   SARS Coronavirus 2 by RT PCR NEGATIVE NEGATIVE   Influenza A by PCR NEGATIVE NEGATIVE   Influenza B by PCR  NEGATIVE NEGATIVE   Imaging Studies: No results found.  ED COURSE and MDM  Nursing notes, initial and subsequent vitals signs, including pulse oximetry, reviewed and interpreted by myself.  Vitals:   03/25/20 0018 03/25/20 0020  BP:  (!) 176/81  Pulse:  75  Resp:  18  Temp:  98.5 F (36.9 C)  TempSrc:  Oral  SpO2:  100%  Weight: 86.2 kg   Height: 5\' 4"  (1.626 m)    Medications - No data to display  Patient negative for COVID-19.  She was advised to avoid contact with Covid positive people and to return if symptoms worsen.  PROCEDURES   Procedures   ED DIAGNOSES     ICD-10-CM   1. COVID-19 virus not detected  Z20.822   2. Close exposure to COVID-19 virus  Z20.822        Shanon Rosser, MD 03/25/20 [phone]

## 2020-03-25 NOTE — ED Triage Notes (Signed)
C/o covid symptoms x 1 week , positive exposure

## 2020-04-20 IMAGING — US RIGHT LOWER EXTREMITY VENOUS ULTRASOUND
1 series · 13 of 24 positions shown · non-contrast
Comparison: None.

CLINICAL DATA: Right lower extremity swelling for 1 month



[Series 1: right lower extremity venous ultrasound · 13 of 33 slices shown]
[im 1/33]
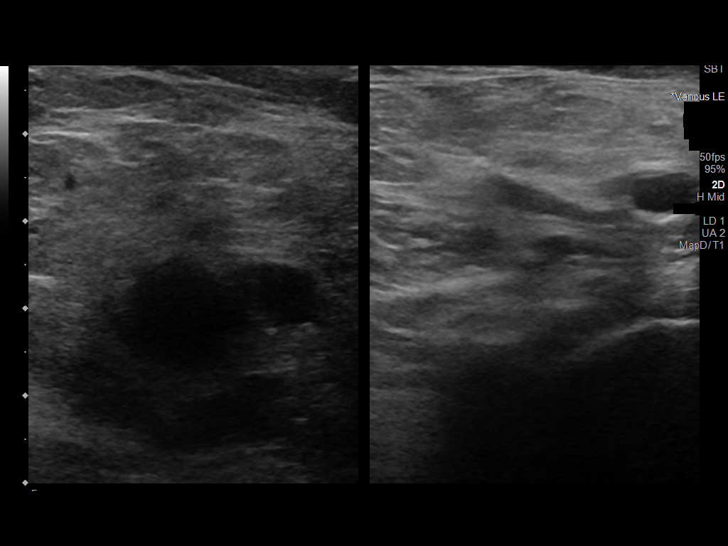
[im 3/33]
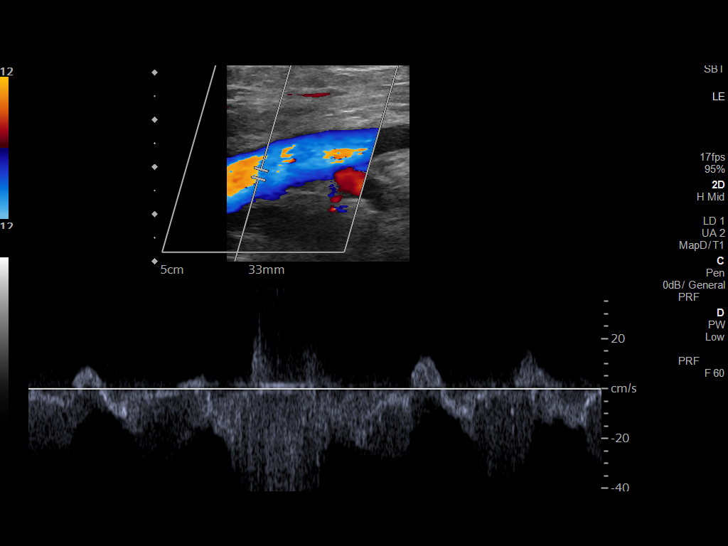
[im 6/33]
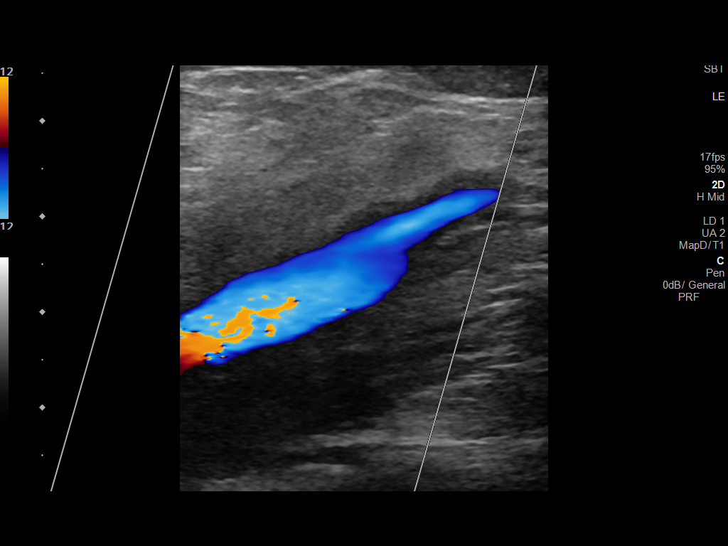
[im 9/33]
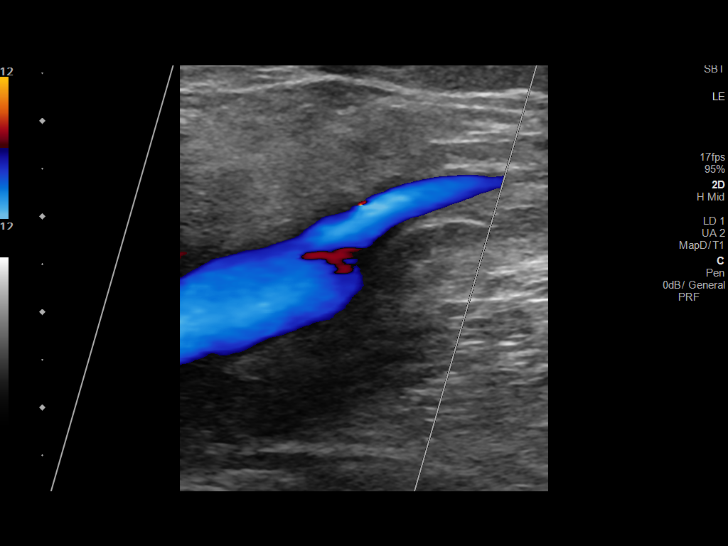
[im 12/33]
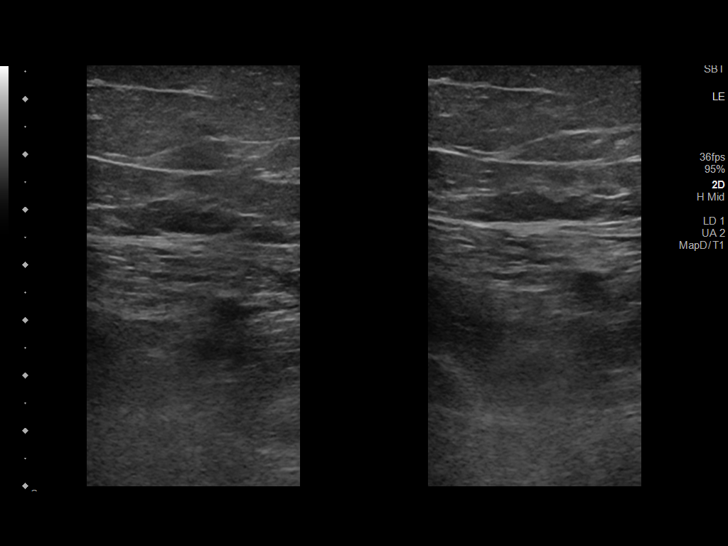
[im 14/33]
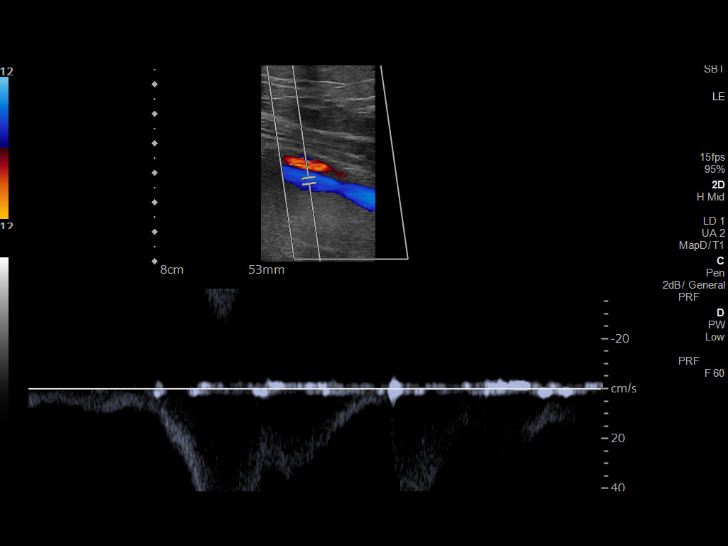
[im 17/33]
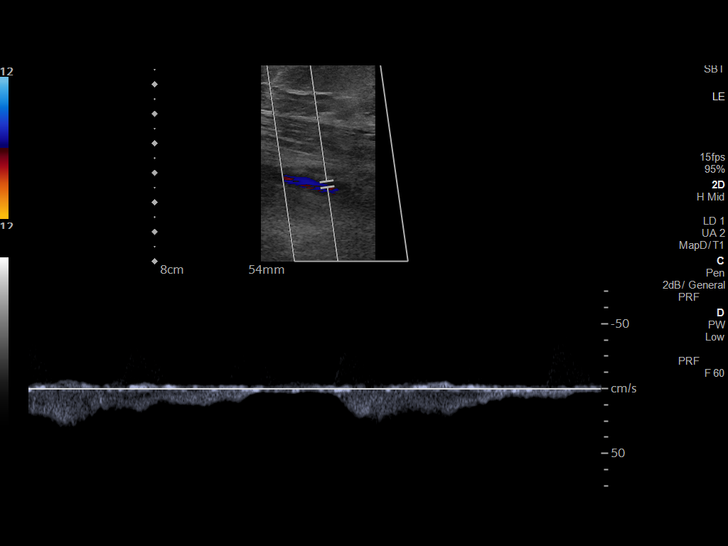
[im 19/33]
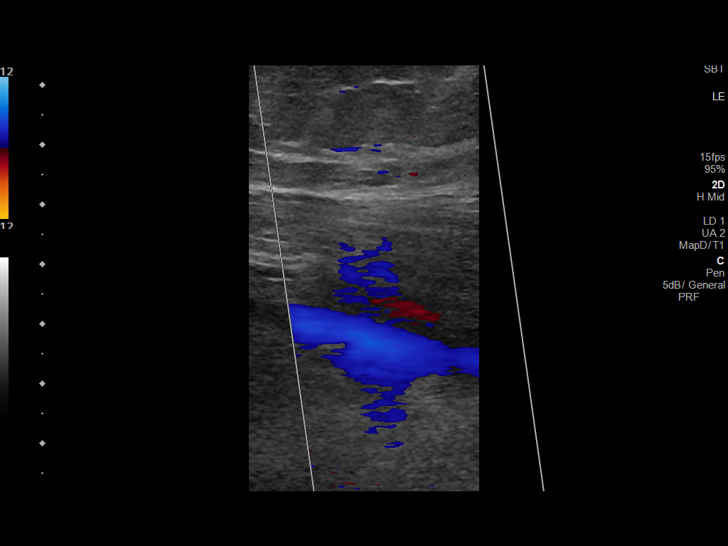
[im 21/33]
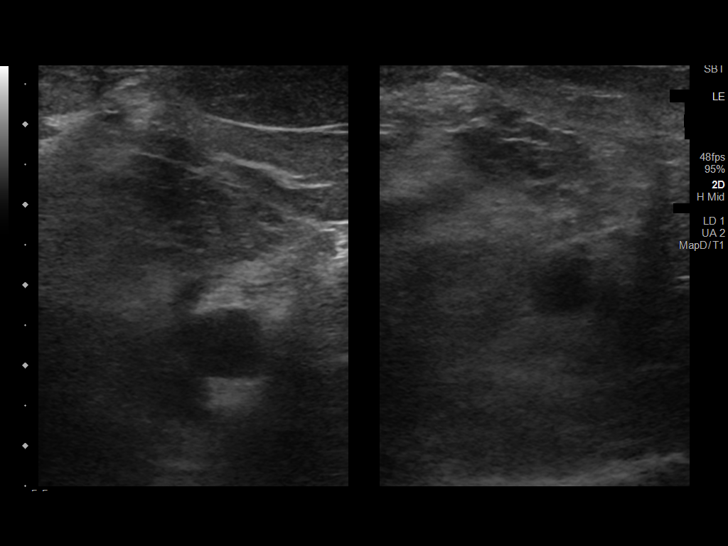
[im 24/33]
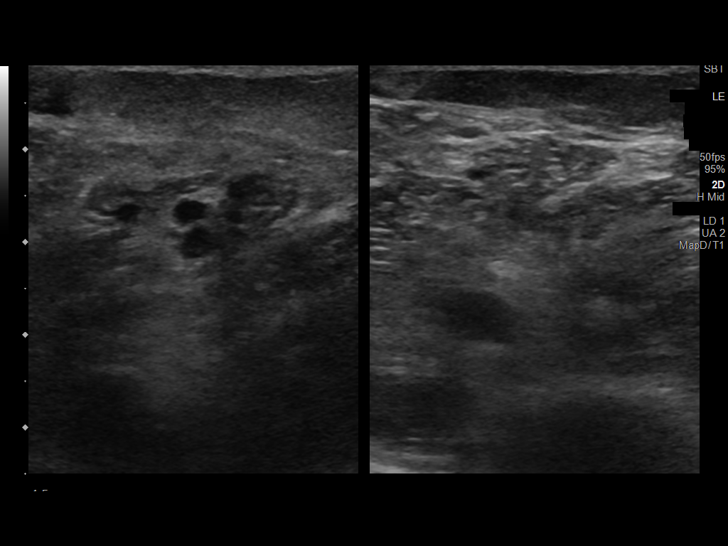
[im 27/33]
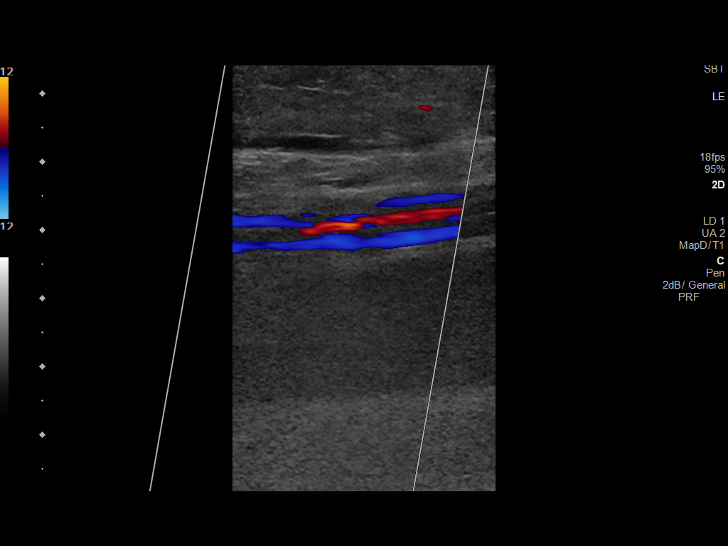
[im 30/33]
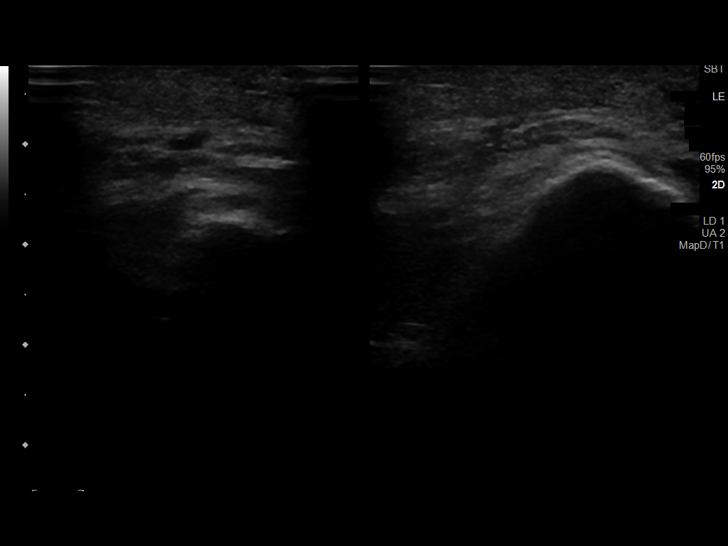
[im 33/33]
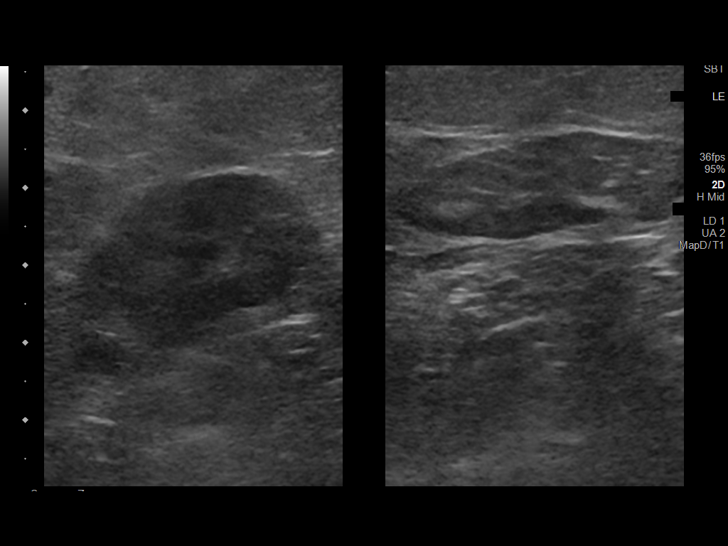

[13 of 24 positions shown; findings below may reference images not displayed]

FINDINGS: Contralateral Common Femoral Vein: Respiratory phasicity is normal
and symmetric with the symptomatic side. No evidence of thrombus.
Normal compressibility.

Common Femoral Vein: No evidence of thrombus. Normal
compressibility, respiratory phasicity and response to augmentation.

Saphenofemoral Junction: No evidence of thrombus. Normal
compressibility and flow on color Doppler imaging.

Profunda Femoral Vein: No evidence of thrombus. Normal
compressibility and flow on color Doppler imaging.

Femoral Vein: No evidence of thrombus. Normal compressibility,
respiratory phasicity and response to augmentation.

Popliteal Vein: No evidence of thrombus. Normal compressibility,
respiratory phasicity and response to augmentation.

Calf Veins: No evidence of thrombus. Normal compressibility and flow
on color Doppler imaging.

Superficial Great Saphenous Vein: No evidence of thrombus. Normal
compressibility.

Venous Reflux:  None.

Other Findings:  None.
IMPRESSION: No evidence of deep venous thrombosis.

## 2020-07-08 ENCOUNTER — Other Ambulatory Visit (HOSPITAL_COMMUNITY): Payer: Self-pay | Admitting: Physician Assistant

## 2020-08-12 ENCOUNTER — Other Ambulatory Visit (HOSPITAL_COMMUNITY): Payer: Self-pay | Admitting: Sports Medicine

## 2022-08-05 ENCOUNTER — Ambulatory Visit: Payer: Self-pay

## 2022-08-05 ENCOUNTER — Ambulatory Visit
Admission: EM | Admit: 2022-08-05 | Discharge: 2022-08-05 | Disposition: A | Discharge status: Home / Self Care | Payer: Medicare Other | Attending: Nurse Practitioner | Admitting: Nurse Practitioner

## 2022-08-05 DIAGNOSIS — R300 Dysuria: Secondary | ICD-10-CM | POA: Diagnosis present

## 2022-08-05 DIAGNOSIS — N3001 Acute cystitis with hematuria: Secondary | ICD-10-CM | POA: Diagnosis present

## 2022-08-05 LAB — POCT URINALYSIS DIP (MANUAL ENTRY)
Bilirubin, UA: NEGATIVE
Glucose, UA: NEGATIVE mg/dL
Ketones, POC UA: NEGATIVE mg/dL
Nitrite, UA: NEGATIVE
Spec Grav, UA: 1.02 (ref 1.010–1.025)
Urobilinogen, UA: 0.2 E.U./dL
pH, UA: 6 (ref 5.0–8.0)

## 2022-08-05 MED ORDER — CEPHALEXIN 500 MG PO CAPS: 500.0000 mg | ORAL_CAPSULE | Freq: Two times a day (BID) | ORAL | 0 refills | Status: AC

## 2022-08-05 NOTE — Discharge Instructions (Addendum)
Antibiotics as prescribed The clinic will contact you with results the urine culture is positive Rest and fluids

## 2022-08-05 NOTE — ED Triage Notes (Signed)
Pt c/o severe pain when voiding.

## 2022-08-05 NOTE — ED Provider Notes (Signed)
[facility]    CSN: [id number] Arrival date & time: 08/05/22  1746      History   Chief Complaint Chief Complaint  Patient presents with   Abdominal Pain    HPI Brandi Moon is a 77 y.o. female presents for evaluation of dysuria.  Patient is accompanied by daughter who helps to translate as she patient only speaks [place].  Patient reports 4 days of burning with urination as well as some incontinence and suprapubic pressure.  Denies any fevers, hematuria, frequency, urgency, flank pain, nausea/vomiting, vaginal discharge or STD concern.  She would like referral to urology.  She is not taking any OTC medications for symptoms.  No other concerns at this time.  Abdominal Pain Associated symptoms: dysuria     Past Medical History:  Diagnosis Date   Arthritis    Cancer [facility])    uterine    Patient Active Problem List   Diagnosis Date Noted   Hyperlipidemia 07/07/2016   Chest pain 07/06/2016   Anxiety 07/06/2016   Hypertension 07/06/2016   Leukocytosis 07/06/2016    Past Surgical History:  Procedure Laterality Date   ABDOMINAL HYSTERECTOMY     histerectomy      OB History   No obstetric history on file.      Home Medications    Prior to Admission medications   Medication Sig Start Date End Date Taking? Authorizing Provider  cephALEXin (KEFLEX) 500 MG capsule Take 1 capsule (500 mg total) by mouth 2 (two) times daily for 7 days. 08/05/22 08/12/22 Yes Melynda Ripple, NP  aspirin EC 81 MG EC tablet Take 1 tablet (81 mg total) by mouth daily. 07/08/16   [name], Venetia Maxon, MD  metoprolol succinate (TOPROL-XL) 25 MG 24 hr tablet Take 1 tablet (25 mg total) by mouth daily. Take with or immediately following a meal. 07/07/16   [name], Venetia Maxon, MD  atorvastatin (LIPITOR) 20 MG tablet Take 1 tablet (20 mg total) by mouth daily at 6 PM. Patient not taking: Reported on 11/09/2017 07/08/16 03/25/20  [name], Venetia Maxon, MD    Family History Family History  Problem  Relation Age of Onset   COPD Mother    Heart attack Father     Social History Social History   Tobacco Use   Smoking status: Never   Smokeless tobacco: Never  Substance Use Topics   Alcohol use: No   Drug use: No     Allergies   Patient has no known allergies.   Review of Systems Review of Systems  Gastrointestinal:  Positive for abdominal pain.  Genitourinary:  Positive for dysuria.     Physical Exam Triage Vital Signs ED Triage Vitals  Enc Vitals Group     BP 08/05/22 1755 125/80     Pulse Rate 08/05/22 1755 79     Resp --      Temp 08/05/22 1755 97.9 F (36.6 C)     Temp Source 08/05/22 1755 Oral     SpO2 08/05/22 1755 95 %     Weight --      Height --      Head Circumference --      Peak Flow --      Pain Score 08/05/22 1754 8     Pain Loc --      Pain Edu? --      Excl. in [place]? --    No data found.  Updated Vital Signs BP 125/80 (BP Location: Right Arm)  Pulse 79   Temp 97.9 F (36.6 C) (Oral)   SpO2 95%   Visual Acuity Right Eye Distance:   Left Eye Distance:   Bilateral Distance:    Right Eye Near:   Left Eye Near:    Bilateral Near:     Physical Exam Vitals and nursing note reviewed.  Constitutional:      Appearance: Normal appearance.  HENT:     Head: Normocephalic and atraumatic.  Eyes:     Pupils: Pupils are equal, round, and reactive to light.  Cardiovascular:     Rate and Rhythm: Normal rate.  Pulmonary:     Effort: Pulmonary effort is normal.  Abdominal:     Tenderness: There is no right CVA tenderness or left CVA tenderness.  Skin:    General: Skin is warm and dry.  Neurological:     General: No focal deficit present.     Mental Status: She is alert and oriented to person, place, and time.  Psychiatric:        Mood and Affect: Mood normal.        Behavior: Behavior normal.      UC Treatments / Results  Labs (all labs ordered are listed, but only abnormal results are displayed) Labs Reviewed  POCT URINALYSIS  DIP (MANUAL ENTRY) - Abnormal; Notable for the following components:      Result Value   Color, UA light yellow (*)    Blood, UA moderate (*)    Protein Ur, POC trace (*)    Leukocytes, UA Small (1+) (*)    All other components within normal limits  URINE CULTURE    EKG   Radiology No results found.  Procedures Procedures (including critical care time)  Medications Ordered in UC Medications - No data to display  Initial Impression / Assessment and Plan / UC Course  I have reviewed the triage vital signs and the nursing notes.  Pertinent labs & imaging results that were available during my care of the patient were reviewed by me and considered in my medical decision making (see chart for details).     UA positive for UTI, send culture Start Keflex twice daily for 7 days Rest and fluids Urology contact information provided Follow-up with PCP if symptoms do not improve ER precautions reviewed and patient verbalized understanding Final Clinical Impressions(s) / UC Diagnoses   Final diagnoses:  Dysuria  Acute cystitis with hematuria     Discharge Instructions      Antibiotics as prescribed The clinic will contact you with results the urine culture is positive Rest and fluids   ED Prescriptions     Medication Sig Dispense Auth. Provider   cephALEXin (KEFLEX) 500 MG capsule Take 1 capsule (500 mg total) by mouth 2 (two) times daily for 7 days. 14 capsule Melynda Ripple, NP      PDMP not reviewed this encounter.   Melynda Ripple, NP 08/05/22 [phone]

## 2022-08-05 NOTE — ED Triage Notes (Signed)
Pt presents with c/o lower abdominal pain X 4 days. States she has sharp pains.

## 2022-08-07 LAB — URINE CULTURE: Culture: <10000 — AB

## 2022-10-15 ENCOUNTER — Ambulatory Visit
Admission: EM | Admit: 2022-10-15 | Discharge: 2022-10-15 | Disposition: A | Discharge status: Home / Self Care | Payer: Medicare Other | Attending: Nurse Practitioner | Admitting: Nurse Practitioner

## 2022-10-15 DIAGNOSIS — N3001 Acute cystitis with hematuria: Secondary | ICD-10-CM | POA: Insufficient documentation

## 2022-10-15 LAB — POCT URINALYSIS DIP (MANUAL ENTRY)
Glucose, UA: 250 mg/dL — AB
Nitrite, UA: POSITIVE — AB
Protein Ur, POC: >=300 mg/dL — AB
Spec Grav, UA: <=1.005 — AB (ref 1.010–1.025)
Urobilinogen, UA: 4 E.U./dL — AB
pH, UA: 5 (ref 5.0–8.0)

## 2022-10-15 MED ORDER — CEPHALEXIN 500 MG PO CAPS: 500.0000 mg | ORAL_CAPSULE | Freq: Two times a day (BID) | ORAL | 0 refills | Status: AC

## 2022-10-15 NOTE — Discharge Instructions (Signed)
Keflex twice daily for 7 days Increase fluids The clinic will contact you with results of the urine culture done today if positive Follow-up with your PCP if your symptoms do not improve Please go to the ER for any worsening symptoms

## 2022-10-15 NOTE — ED Provider Notes (Signed)
[facility]    CSN: [id number] Arrival date & time: 10/15/22  1421      History   Chief Complaint Chief Complaint  Patient presents with   Abdominal Pain    HPI Brandi Moon is a 77 y.o. female presents for evaluation of dysuria.  Patient is accompanied by family member who helps to translate as she speaks mostly [place].  Patient reports 3 days of urinary burning, urgency, frequency.  Denies hematuria, fevers, nausea/vomiting, flank pain.  No vaginal discharge or STD concern.  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  Has taken Azo OTC for symptoms.  No other concerns at this time.   Abdominal Pain Associated symptoms: dysuria     Past Medical History:  Diagnosis Date   Arthritis    Cancer [facility])    uterine    Patient Active Problem List   Diagnosis Date Noted   Hyperlipidemia 07/07/2016   Chest pain 07/06/2016   Anxiety 07/06/2016   Hypertension 07/06/2016   Leukocytosis 07/06/2016    Past Surgical History:  Procedure Laterality Date   ABDOMINAL HYSTERECTOMY     histerectomy      OB History   No obstetric history on file.      Home Medications    Prior to Admission medications   Medication Sig Start Date End Date Taking? Authorizing Provider  cephALEXin (KEFLEX) 500 MG capsule Take 1 capsule (500 mg total) by mouth 2 (two) times daily for 7 days. 10/15/22 10/22/22 Yes Radford Pax, NP  aspirin EC 81 MG EC tablet Take 1 tablet (81 mg total) by mouth daily. 07/08/16   [name], Maryruth Bun, MD  metoprolol succinate (TOPROL-XL) 25 MG 24 hr tablet Take 1 tablet (25 mg total) by mouth daily. Take with or immediately following a meal. 07/07/16   [name], Maryruth Bun, MD  atorvastatin (LIPITOR) 20 MG tablet Take 1 tablet (20 mg total) by mouth daily at 6 PM. Patient not taking: Reported on 11/09/2017 07/08/16 03/25/20  [name], Maryruth Bun, MD    Family History Family History  Problem Relation Age of Onset   COPD Mother    Heart attack Father     Social  History Social History   Tobacco Use   Smoking status: Never   Smokeless tobacco: Never  Substance Use Topics   Alcohol use: No   Drug use: No     Allergies   Patient has no known allergies.   Review of Systems Review of Systems  Genitourinary:  Positive for dysuria.     Physical Exam Triage Vital Signs ED Triage Vitals  Enc Vitals Group     BP 10/15/22 1539 (!) 159/80     Pulse Rate 10/15/22 1539 71     Resp 10/15/22 1539 16     Temp 10/15/22 1539 98.2 F (36.8 C)     Temp Source 10/15/22 1539 Oral     SpO2 10/15/22 1539 94 %     Weight --      Height --      Head Circumference --      Peak Flow --      Pain Score 10/15/22 1541 7     Pain Loc --      Pain Edu? --      Excl. in [place]? --    No data found.  Updated Vital Signs BP (!) 159/80 (BP Location: Left Arm)   Pulse 71   Temp 98.2 F (36.8 C) (Oral)   Resp  16   SpO2 94%   Visual Acuity Right Eye Distance:   Left Eye Distance:   Bilateral Distance:    Right Eye Near:   Left Eye Near:    Bilateral Near:     Physical Exam Vitals and nursing note reviewed.  Constitutional:      Appearance: Normal appearance.  HENT:     Head: Normocephalic and atraumatic.  Eyes:     Pupils: Pupils are equal, round, and reactive to light.  Cardiovascular:     Rate and Rhythm: Normal rate.  Pulmonary:     Effort: Pulmonary effort is normal.  Abdominal:     Tenderness: There is no right CVA tenderness or left CVA tenderness.  Skin:    General: Skin is warm and dry.  Neurological:     General: No focal deficit present.     Mental Status: She is alert and oriented to person, place, and time.  Psychiatric:        Mood and Affect: Mood normal.        Behavior: Behavior normal.      UC Treatments / Results  Labs (all labs ordered are listed, but only abnormal results are displayed) Labs Reviewed  POCT URINALYSIS DIP (MANUAL ENTRY) - Abnormal; Notable for the following components:      Result Value    Color, UA orange (*)    Clarity, UA cloudy (*)    Glucose, UA =250 (*)    Bilirubin, UA small (*)    Ketones, POC UA trace (5) (*)    Spec Grav, UA <=1.005 (*)    Blood, UA large (*)    Protein Ur, POC >=300 (*)    Urobilinogen, UA 4.0 (*)    Nitrite, UA Positive (*)    Leukocytes, UA Large (3+) (*)    All other components within normal limits  URINE CULTURE   Urine Culture Order: [id number] Status: Final result     Visible to patient: Yes (not seen)     Next appt: None     Dx: Acute cystitis with hematuria   Specimen Information: Urine, Clean Catch  3 Result Notes    Component 2 mo ago  Specimen Description URINE, CLEAN CATCH  Special Requests NONE  Culture  Abnormal  <10,000 COLONIES/mL INSIGNIFICANT GROWTH Performed at [facility], [address], [place], [place] [address]   Report Status 08/07/2022 FINAL  Resulting Agency [facility]         Specimen Collected: 08/05/22 18:37 Last Resulted: 08/07/22 09:30        EKG   Radiology No results found.  Procedures Procedures (including critical care time)  Medications Ordered in UC Medications - No data to display  Initial Impression / Assessment and Plan / UC Course  I have reviewed the triage vital signs and the nursing notes.  Pertinent labs & imaging results that were available during my care of the patient were reviewed by me and considered in my medical decision making (see chart for details).     Reviewed urine culture results from March 2024. Patient took Azo, will send urine for culture and treat based on symptoms Keflex twice daily for 7 days Fluids encouraged PCP follow-up if symptoms do not improve ER precautions reviewed and patient verbalized understanding Final Clinical Impressions(s) / UC Diagnoses   Final diagnoses:  Acute cystitis with hematuria     Discharge Instructions      Keflex twice daily for 7 days Increase fluids The  clinic will contact you with results of  the urine culture done today if positive Follow-up with your PCP if your symptoms do not improve Please go to the ER for any worsening symptoms    ED Prescriptions     Medication Sig Dispense Auth. Provider   cephALEXin (KEFLEX) 500 MG capsule Take 1 capsule (500 mg total) by mouth 2 (two) times daily for 7 days. 14 capsule Radford Pax, NP      PDMP not reviewed this encounter.   Radford Pax, NP 10/15/22 [phone]

## 2022-10-15 NOTE — ED Triage Notes (Signed)
Pt presents to UC w/ c/o dysuria x3 days.

## 2022-10-17 LAB — URINE CULTURE: Culture: >=100000 — AB

## 2022-10-18 LAB — URINE CULTURE

## 2022-11-12 ENCOUNTER — Other Ambulatory Visit: Payer: Self-pay

## 2022-11-12 ENCOUNTER — Inpatient Hospital Stay: Admission: RE | Admit: 2022-11-12 | Payer: Self-pay | Source: Ambulatory Visit

## 2022-11-12 ENCOUNTER — Ambulatory Visit
Admission: EM | Admit: 2022-11-12 | Discharge: 2022-11-12 | Disposition: A | Discharge status: Home / Self Care | Payer: Medicare Other | Attending: Family Medicine | Admitting: Family Medicine

## 2022-11-12 DIAGNOSIS — N3001 Acute cystitis with hematuria: Secondary | ICD-10-CM | POA: Diagnosis not present

## 2022-11-12 LAB — POCT URINALYSIS DIP (MANUAL ENTRY)
Bilirubin, UA: NEGATIVE
Glucose, UA: NEGATIVE mg/dL
Ketones, POC UA: NEGATIVE mg/dL
Nitrite, UA: NEGATIVE
Protein Ur, POC: 30 mg/dL — AB
Spec Grav, UA: 1.025 (ref 1.010–1.025)
Urobilinogen, UA: 0.2 E.U./dL
pH, UA: 6 (ref 5.0–8.0)

## 2022-11-12 MED ORDER — SULFAMETHOXAZOLE-TRIMETHOPRIM 800-160 MG PO TABS: 1.0000 | ORAL_TABLET | Freq: Two times a day (BID) | ORAL | 0 refills | Status: AC

## 2022-11-12 NOTE — Discharge Instructions (Addendum)
Advised patient/family to take medication as directed with food to completion.  Encouraged increase daily water intake to 64 ounces per day while taking this medication.  Advised we will follow-up with urine culture results once received.  Advised if symptoms worsen and/or unresolved please follow-up with [facility] or here for further evaluation.  Contact information for this provider is below on AVS.

## 2022-11-12 NOTE — ED Triage Notes (Signed)
Pt presents to uc with family who is translating for her. Pt has had reoccurring utis and has been sent to a urologist but does not have an appointment until end of July. Pt reports she has finished her antibiotic but symptoms never improved pt has taken keflex 2 times for same symptoms.

## 2022-11-12 NOTE — ED Provider Notes (Signed)
[facility]    CSN: [id number] Arrival date & time: 11/12/22  0948      History   Chief Complaint Chief Complaint  Patient presents with   Dysuria    HPI Brandi Moon is a 77 y.o. female.   HPI Pleasant 77 year old female presents with dysuria/frequency.  PMH significant for uterine cancer, HTN, and HLD.  Patient is accompanied by her daughter this morning who will help with translation.  Past Medical History:  Diagnosis Date   Arthritis    Cancer [facility])    uterine    Patient Active Problem List   Diagnosis Date Noted   Hyperlipidemia 07/07/2016   Chest pain 07/06/2016   Anxiety 07/06/2016   Hypertension 07/06/2016   Leukocytosis 07/06/2016    Past Surgical History:  Procedure Laterality Date   ABDOMINAL HYSTERECTOMY     histerectomy      OB History   No obstetric history on file.      Home Medications    Prior to Admission medications   Medication Sig Start Date End Date Taking? Authorizing Provider  sulfamethoxazole-trimethoprim (BACTRIM DS) 800-160 MG tablet Take 1 tablet by mouth 2 (two) times daily for 7 days. 11/12/22 11/19/22 Yes Trevor Iha, FNP  aspirin EC 81 MG EC tablet Take 1 tablet (81 mg total) by mouth daily. 07/08/16   [name], Maryruth Bun, MD  metoprolol succinate (TOPROL-XL) 25 MG 24 hr tablet Take 1 tablet (25 mg total) by mouth daily. Take with or immediately following a meal. 07/07/16   [name], Maryruth Bun, MD  atorvastatin (LIPITOR) 20 MG tablet Take 1 tablet (20 mg total) by mouth daily at 6 PM. Patient not taking: Reported on 11/09/2017 07/08/16 03/25/20  [name], Maryruth Bun, MD    Family History Family History  Problem Relation Age of Onset   COPD Mother    Heart attack Father     Social History Social History   Tobacco Use   Smoking status: Never   Smokeless tobacco: Never  Substance Use Topics   Alcohol use: No   Drug use: No     Allergies   Patient has no known allergies.   Review of Systems Review of  Systems  Genitourinary:  Positive for dysuria, frequency and urgency.  All other systems reviewed and are negative.    Physical Exam Triage Vital Signs ED Triage Vitals  Enc Vitals Group     BP 11/12/22 1008 (!) 147/81     Pulse Rate 11/12/22 1008 64     Resp 11/12/22 1008 16     Temp 11/12/22 1008 97.6 F (36.4 C)     Temp src --      SpO2 11/12/22 1008 98 %     Weight --      Height --      Head Circumference --      Peak Flow --      Pain Score 11/12/22 1006 5     Pain Loc --      Pain Edu? --      Excl. in [place]? --    No data found.  Updated Vital Signs BP (!) 147/81   Pulse 64   Temp 97.6 F (36.4 C)   Resp 16   SpO2 98%    Physical Exam Vitals and nursing note reviewed.  Constitutional:      Appearance: Normal appearance. She is obese.  HENT:     Head: Normocephalic and atraumatic.     Mouth/Throat:  Mouth: Mucous membranes are moist.     Pharynx: Oropharynx is clear.  Eyes:     Extraocular Movements: Extraocular movements intact.     Conjunctiva/sclera: Conjunctivae normal.     Pupils: Pupils are equal, round, and reactive to light.  Cardiovascular:     Rate and Rhythm: Normal rate and regular rhythm.     Pulses: Normal pulses.     Heart sounds: Normal heart sounds.  Pulmonary:     Effort: Pulmonary effort is normal.     Breath sounds: Normal breath sounds. No wheezing, rhonchi or rales.  Abdominal:     Tenderness: There is no right CVA tenderness or left CVA tenderness.  Musculoskeletal:        General: Normal range of motion.     Cervical back: Normal range of motion and neck supple.  Skin:    General: Skin is warm and dry.  Neurological:     General: No focal deficit present.     Mental Status: She is alert and oriented to person, place, and time. Mental status is at baseline.  Psychiatric:        Mood and Affect: Mood normal.        Behavior: Behavior normal.      UC Treatments / Results  Labs (all labs ordered are listed, but  only abnormal results are displayed) Labs Reviewed  POCT URINALYSIS DIP (MANUAL ENTRY) - Abnormal; Notable for the following components:      Result Value   Clarity, UA cloudy (*)    Blood, UA moderate (*)    Protein Ur, POC =30 (*)    Leukocytes, UA Large (3+) (*)    All other components within normal limits  URINE CULTURE    EKG   Radiology No results found.  Procedures Procedures (including critical care time)  Medications Ordered in UC Medications - No data to display  Initial Impression / Assessment and Plan / UC Course  I have reviewed the triage vital signs and the nursing notes.  Pertinent labs & imaging results that were available during my care of the patient were reviewed by me and considered in my medical decision making (see chart for details).     MDM: 1.  Acute cystitis with hematuria-UA reveals above, urine culture ordered Rx'd Bactrim DS 800/160 mg tablet twice daily x 7 days. Advised patient/family to take medication as directed with food to completion.  Encouraged increase daily water intake to 64 ounces per day while taking this medication.  Advised we will follow-up with urine culture results once received.  Advised if symptoms worsen and/or unresolved please follow-up with [facility] or here for further evaluation.  Contact information for this provider is below on AVS. patient discharged home, hemodynamically stable.  Final Clinical Impressions(s) / UC Diagnoses   Final diagnoses:  Acute cystitis with hematuria     Discharge Instructions      Advised patient/family to take medication as directed with food to completion.  Encouraged increase daily water intake to 64 ounces per day while taking this medication.  Advised we will follow-up with urine culture results once received.  Advised if symptoms worsen and/or unresolved please follow-up with [facility] or here for further evaluation.  Contact information for this provider is below  on AVS.     ED Prescriptions     Medication Sig Dispense Auth. Provider   sulfamethoxazole-trimethoprim (BACTRIM DS) 800-160 MG tablet Take 1 tablet by mouth 2 (two) times daily for 7  days. 14 tablet Trevor Iha, FNP      PDMP not reviewed this encounter.   Trevor Iha, FNP 11/12/22 1055

## 2022-11-13 LAB — URINE CULTURE

## 2022-11-14 LAB — URINE CULTURE: Culture: >=100000 — AB

## 2022-11-24 ENCOUNTER — Ambulatory Visit: Payer: Medicare Other | Admitting: Urology

## 2022-11-24 ENCOUNTER — Encounter: Payer: Self-pay | Admitting: Urology

## 2022-11-24 VITALS — BP 148/73

## 2022-11-24 DIAGNOSIS — N3281 Overactive bladder: Secondary | ICD-10-CM | POA: Diagnosis not present

## 2022-11-24 DIAGNOSIS — Z87440 Personal history of urinary (tract) infections: Secondary | ICD-10-CM

## 2022-11-24 DIAGNOSIS — R339 Retention of urine, unspecified: Secondary | ICD-10-CM

## 2022-11-24 DIAGNOSIS — N3942 Incontinence without sensory awareness: Secondary | ICD-10-CM

## 2022-11-24 DIAGNOSIS — R3129 Other microscopic hematuria: Secondary | ICD-10-CM | POA: Diagnosis not present

## 2022-11-24 DIAGNOSIS — N390 Urinary tract infection, site not specified: Secondary | ICD-10-CM

## 2022-11-24 MED ORDER — SULFAMETHOXAZOLE-TRIMETHOPRIM 400-80 MG PO TABS: 1.0000 | ORAL_TABLET | Freq: Two times a day (BID) | ORAL | 3 refills | Status: DC

## 2022-11-24 MED ORDER — ESTRADIOL 0.1 MG/GM VA CREA: TOPICAL_CREAM | VAGINAL | 12 refills | Status: DC

## 2022-11-24 NOTE — Progress Notes (Signed)
Assessment: 1. 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 Other microscopic hematuria   3. Recurrent UTI   4. OAB (overactive bladder)   5. Incomplete bladder emptying      Plan: Today I had a long and detailed discussion with the patient and her daughter regarding her multiple urologic issues. Given her significant microscopic hematuria I recommended formal hematuria evaluation with upper tract imaging (CT-hematuria protocol and follow-up for cystoscopy/female exam.  BMP ordered today to assess renal function  I also discussed in detail UTI prevention strategies and gave them educational material in this regard.  I have also recommended new medical therapy for recurrent UTI-- Urinary probiotic with lactobacillus recommended Rx: Estrace vaginal cream 3 times weekly Rx: Bactrim regular strength 1 p.o. nightly for low-dose prophylaxis  Patient will follow-up in 1 month to review results of CT scan and perform female exam and cystoscopy.  Chief Complaint: Frequent urinary tract infection and incontinence.  History of Present Illness:  Brandi Moon is a 77 y.o. female who is seen in consultation from Duwayne Heck, MD for evaluation of recurrent UTI. Patient is accompanied today by her daughter who helps with translation.  They are [place]. Patient has been seen in the emergency room 3 times in the last 3 months for the acute onset of frequency, urgency, and dysuria consistent with UTI.  Her last culture grew Morganella that was resistant to Macrobid but sensitive to Bactrim.  Prior to that she grew a pansensitive E. coli.  Patient reports a several year history of urinary incontinence and voiding complaints.  She states that she wears several diapers a day and leaks intermittently substantial volumes without really sensory awareness.  At other times however she does have urgency and cannot get to the bathroom in time.  PVR today=  Patient  denies any gross hematuria flank pain or other urologic complaints.  Patient does have a remote history of uterine or cervical cancer treated with radiation therapy in 1992 without recurrence.     Past Medical History:  Past Medical History:  Diagnosis Date   Arthritis    Cancer (HCC)    uterine    Past Surgical History:  Past Surgical History:  Procedure Laterality Date   ABDOMINAL HYSTERECTOMY     histerectomy      Allergies:  No Known Allergies  Family History:  Family History  Problem Relation Age of Onset   COPD Mother    Heart attack Father     Social History:  Social History   Tobacco Use   Smoking status: Never   Smokeless tobacco: Never  Substance Use Topics   Alcohol use: No   Drug use: No    Review of symptoms:  Constitutional:  Negative for unexplained weight loss, night sweats, fever, chills ENT:  Negative for nose bleeds, sinus pain, painful swallowing CV:  Negative for chest pain, shortness of breath, exercise intolerance, palpitations, loss of consciousness Resp:  Negative for cough, wheezing, shortness of breath GI:  Negative for nausea, vomiting, diarrhea, bloody stools GU:  Positives noted in HPI; otherwise negative for gross hematuria, dysuria, urinary incontinence Neuro:  Negative for seizures, poor balance, limb weakness, slurred speech Psych:  Negative for lack of energy, depression, anxiety Endocrine:  Negative for polydipsia, polyuria, symptoms of hypoglycemia (dizziness, hunger, sweating) Hematologic:  Negative for anemia, purpura, petechia, prolonged or excessive bleeding, use of anticoagulants  Allergic:  Negative for difficulty breathing or choking as a result of exposure  to anything; no shellfish allergy; no allergic response (rash/itch) to materials, foods  Physical exam: BP (!) 148/73  GENERAL APPEARANCE:  Well appearing, well developed, well nourished, NAD    Results: UA show significant microhematuria

## 2022-11-25 LAB — MICROSCOPIC EXAMINATION
Crystal Type: NONE SEEN
Crystals: NONE SEEN
Renal Epithel, UA: NONE SEEN /hpf
Trichomonas, UA: NONE SEEN
Yeast, UA: NONE SEEN

## 2022-11-25 LAB — BASIC METABOLIC PANEL
BUN/Creatinine Ratio: 20 (ref 12–28)
BUN: 18 mg/dL (ref 8–27)
CO2: 23 mmol/L (ref 20–29)
Calcium: 9.4 mg/dL (ref 8.7–10.3)
Chloride: 103 mmol/L (ref 96–106)
Creatinine, Ser: 0.89 mg/dL (ref 0.57–1.00)
Glucose: 91 mg/dL (ref 70–99)
Potassium: 6 mmol/L — ABNORMAL HIGH (ref 3.5–5.2)
Sodium: 140 mmol/L (ref 134–144)
eGFR: 67 mL/min/{1.73_m2} (ref >59)

## 2022-11-25 LAB — URINALYSIS, ROUTINE W REFLEX MICROSCOPIC
Bilirubin, UA: NEGATIVE
Glucose, UA: NEGATIVE
Ketones, UA: NEGATIVE
Leukocytes,UA: NEGATIVE
Nitrite, UA: NEGATIVE
Protein,UA: NEGATIVE
Specific Gravity, UA: 1.02 (ref 1.005–1.030)
Urobilinogen, Ur: 0.2 mg/dL (ref 0.2–1.0)
pH, UA: 6 (ref 5.0–7.5)

## 2022-11-26 LAB — URINE CULTURE

## 2022-11-28 ENCOUNTER — Other Ambulatory Visit: Payer: Self-pay

## 2022-11-28 DIAGNOSIS — E875 Hyperkalemia: Secondary | ICD-10-CM

## 2022-11-28 MED ORDER — ESTRADIOL 0.1 MG/GM VA CREA: TOPICAL_CREAM | VAGINAL | 12 refills | Status: DC

## 2022-11-28 MED ORDER — SULFAMETHOXAZOLE-TRIMETHOPRIM 400-80 MG PO TABS: 1.0000 | ORAL_TABLET | Freq: Two times a day (BID) | ORAL | 3 refills | Status: DC

## 2022-11-29 ENCOUNTER — Other Ambulatory Visit: Payer: Medicare Other

## 2022-11-29 ENCOUNTER — Other Ambulatory Visit: Payer: Self-pay

## 2022-11-29 DIAGNOSIS — E875 Hyperkalemia: Secondary | ICD-10-CM

## 2022-11-29 LAB — BASIC METABOLIC PANEL
BUN/Creatinine Ratio: 19 (ref 12–28)
BUN: 16 mg/dL (ref 8–27)
CO2: 26 mmol/L (ref 20–29)
Calcium: 9.3 mg/dL (ref 8.7–10.3)
Chloride: 105 mmol/L (ref 96–106)
Creatinine, Ser: 0.86 mg/dL (ref 0.57–1.00)
Glucose: 90 mg/dL (ref 70–99)
Potassium: 5.3 mmol/L — ABNORMAL HIGH (ref 3.5–5.2)
Sodium: 142 mmol/L (ref 134–144)
eGFR: 70 mL/min/{1.73_m2} (ref >59)

## 2022-12-01 ENCOUNTER — Encounter (HOSPITAL_COMMUNITY): Payer: Self-pay

## 2022-12-01 ENCOUNTER — Other Ambulatory Visit: Payer: Self-pay

## 2022-12-01 ENCOUNTER — Ambulatory Visit (HOSPITAL_COMMUNITY)
Admission: RE | Admit: 2022-12-01 | Discharge: 2022-12-01 | Disposition: A | Discharge status: Home / Self Care | Payer: Medicare Other | Source: Ambulatory Visit | Attending: Emergency Medicine | Admitting: Emergency Medicine

## 2022-12-01 VITALS — BP 164/81 | HR 80 | Temp 98.1°F | Resp 18

## 2022-12-01 DIAGNOSIS — E875 Hyperkalemia: Secondary | ICD-10-CM

## 2022-12-01 LAB — BASIC METABOLIC PANEL
Anion gap: 12 (ref 5–15)
BUN: 15 mg/dL (ref 8–23)
CO2: 24 mmol/L (ref 22–32)
Calcium: 8.8 mg/dL — ABNORMAL LOW (ref 8.9–10.3)
Chloride: 103 mmol/L (ref 98–111)
Creatinine, Ser: 0.92 mg/dL (ref 0.44–1.00)
GFR, Estimated: >60 mL/min (ref >60)
Glucose, Bld: 92 mg/dL (ref 70–99)
Potassium: 4.5 mmol/L (ref 3.5–5.1)
Sodium: 139 mmol/L (ref 135–145)

## 2022-12-01 MED ORDER — FUROSEMIDE 40 MG PO TABS: 40.0000 mg | ORAL_TABLET | Freq: Every morning | ORAL | 0 refills | Status: DC

## 2022-12-01 NOTE — ED Triage Notes (Signed)
Pt is been seen by Urology and her potassium is been reading high x 2. Pt having HTN with no treatment.

## 2022-12-01 NOTE — ED Provider Notes (Signed)
[facility]    CSN: [id number] Arrival date & time: 12/01/22  1630    HISTORY   Chief Complaint  Patient presents with   Hypertension    abnormal lab - Entered by patient   HPI Brandi Moon is a pleasant, 77 y.o. female who presents to urgent care today. Patient was seen by her urologist on November 24, 2022.  Basic metabolic panel was performed to assess her renal function.  This revealed a potassium level of 6 mmol/L.  Her urologist ordered repeat testing on July 2 which revealed a potassium level of 5.3 mmol/L.  Patient states her urologist advised her to follow-up with her primary care provider.  Patient states she does not have a primary care provider at this time.  Patient states she is not experiencing any chest pain, shortness of breath, palpitations or altered mental status at this time.  Patient is here with her son and daughter who states she had been eating a lot of bananas a few weeks ago because her muscles have been aching, patient states her muscles are no longer aching at this time.  The history is provided by the patient.   Past Medical History:  Diagnosis Date   Arthritis    Cancer [facility])    uterine   Patient Active Problem List   Diagnosis Date Noted   Hyperlipidemia 07/07/2016   Chest pain 07/06/2016   Anxiety 07/06/2016   Hypertension 07/06/2016   Leukocytosis 07/06/2016   Past Surgical History:  Procedure Laterality Date   ABDOMINAL HYSTERECTOMY     histerectomy     OB History   No obstetric history on file.    Home Medications    Prior to Admission medications   Medication Sig Start Date End Date Taking? Authorizing Provider  estradiol (ESTRACE) 0.1 MG/GM vaginal cream Apply 3x weekly as directed using a pea sized amount on finger tip 11/28/22   Joline Maxcy, MD  metoprolol succinate (TOPROL-XL) 25 MG 24 hr tablet Take 1 tablet (25 mg total) by mouth daily. Take with or immediately following a meal. 07/07/16   [name], Maryruth Bun, MD   sulfamethoxazole-trimethoprim (BACTRIM) 400-80 MG tablet Take 1 tablet by mouth 2 (two) times daily. 11/28/22   Joline Maxcy, MD  atorvastatin (LIPITOR) 20 MG tablet Take 1 tablet (20 mg total) by mouth daily at 6 PM. Patient not taking: Reported on 11/09/2017 07/08/16 03/25/20  [name], Maryruth Bun, MD    Family History Family History  Problem Relation Age of Onset   COPD Mother    Heart attack Father    Social History Social History   Tobacco Use   Smoking status: Never   Smokeless tobacco: Never  Substance Use Topics   Alcohol use: No   Drug use: No   Allergies   Patient has no known allergies.  Review of Systems Review of Systems Pertinent findings revealed after performing a 14 point review of systems has been noted in the history of present illness.  Physical Exam Vital Signs BP (!) 164/81 (BP Location: Right Arm)   Pulse 80   Temp 98.1 F (36.7 C) (Oral)   Resp 18   SpO2 95%   No data found.  Physical Exam Vitals and nursing note reviewed.  Constitutional:      General: She is not in acute distress.    Appearance: Normal appearance.  HENT:     Head: Normocephalic and atraumatic.  Eyes:     Pupils: Pupils are equal,  round, and reactive to light.  Cardiovascular:     Rate and Rhythm: Normal rate and regular rhythm.  Pulmonary:     Effort: Pulmonary effort is normal.     Breath sounds: Normal breath sounds.  Musculoskeletal:        General: Normal range of motion.     Cervical back: Normal range of motion and neck supple.  Skin:    General: Skin is warm and dry.  Neurological:     General: No focal deficit present.     Mental Status: She is alert and oriented to person, place, and time. Mental status is at baseline.  Psychiatric:        Mood and Affect: Mood normal.        Behavior: Behavior normal.        Thought Content: Thought content normal.        Judgment: Judgment normal.     Visual Acuity Right Eye Distance:   Left Eye Distance:    Bilateral Distance:    Right Eye Near:   Left Eye Near:    Bilateral Near:     UC Couse / Diagnostics / Procedures:     Radiology No results found.  Procedures Procedures (including critical care time) EKG  Pending results:  Labs Reviewed  BASIC METABOLIC PANEL    Medications Ordered in UC: Medications - No data to display  UC Diagnoses / Final Clinical Impressions(s)   I have reviewed the triage vital signs and the nursing notes.  Pertinent labs & imaging results that were available during my care of the patient were reviewed by me and considered in my medical decision making (see chart for details).    Final diagnoses:  Hyperkalemia   Stat BMP obtained during her visit today.  We will notify her of her potassium level once complete.  Patient advised that if it is still elevated she will require treatment with Lasix 40 mg 1 tablet daily in the morning.  Patient cautioned not to begin this medication until advised how many days she will need to take it.  Please see discharge instructions below for details of plan of care as provided to patient. ED Prescriptions     Medication Sig Dispense Auth. Provider   furosemide (LASIX) 40 MG tablet Take 1 tablet (40 mg total) by mouth in the morning for 5 days. 5 tablet Theadora [name] Scales, PA-C      PDMP not reviewed this encounter.  Pending results:  Labs Reviewed  BASIC METABOLIC PANEL    Discharge Instructions:   Discharge Instructions      We will contact you with the results of your potassium level test later this afternoon.  If your level is still elevated, you will need to take a medication called lasix to help your body eliminate excess potassium.    You will take one tablet daily in the morning.  We will let you know how many days you will need to take it based on your potassium level today.   Your daughter is going to help you find a primary care provider for regular monitoring of your potassium  levels.    If you experience palpitations, chest pain or shortness breath, please go to the emergency room for more acute evaluation.       Disposition Upon Discharge:  Condition: stable for discharge home  Patient presented with an acute illness with associated systemic symptoms and significant discomfort requiring urgent management. In my opinion, this is a condition  that a prudent lay person (someone who possesses an average knowledge of health and medicine) may potentially expect to result in complications if not addressed urgently such as respiratory distress, impairment of bodily function or dysfunction of bodily organs.   Routine symptom specific, illness specific and/or disease specific instructions were discussed with the patient and/or caregiver at length.   As such, the patient has been evaluated and assessed, work-up was performed and treatment was provided in alignment with urgent care protocols and evidence based medicine.  Patient/parent/caregiver has been advised that the patient may require follow up for further testing and treatment if the symptoms continue in spite of treatment, as clinically indicated and appropriate.  Patient/parent/caregiver has been advised to return to the [facility] or PCP if no better; to PCP or the Emergency Department if new signs and symptoms develop, or if the current signs or symptoms continue to change or worsen for further workup, evaluation and treatment as clinically indicated and appropriate  The patient will follow up with their current PCP if and as advised. If the patient does not currently have a PCP we will assist them in obtaining one.   The patient may need specialty follow up if the symptoms continue, in spite of conservative treatment and management, for further workup, evaluation, consultation and treatment as clinically indicated and appropriate.  Patient/parent/caregiver verbalized understanding and agreement of plan as discussed.  All  questions were addressed during visit.  Please see discharge instructions below for further details of plan.  This office note has been dictated using Teaching laboratory technician.  Unfortunately, this method of dictation can sometimes lead to typographical or grammatical errors.  I apologize for your inconvenience in advance if this occurs.  Please do not hesitate to reach out to me if clarification is needed.      Theadora [name] Scales, PA-C 12/01/22 [phone]

## 2022-12-01 NOTE — Discharge Instructions (Addendum)
We will contact you with the results of your potassium level test later this afternoon.  If your level is still elevated, you will need to take a medication called lasix to help your body eliminate excess potassium.    You will take one tablet daily in the morning.  We will let you know how many days you will need to take it based on your potassium level today.   Your daughter is going to help you find a primary care provider for regular monitoring of your potassium levels.    If you experience palpitations, chest pain or shortness breath, please go to the emergency room for more acute evaluation.

## 2022-12-02 ENCOUNTER — Ambulatory Visit (INDEPENDENT_AMBULATORY_CARE_PROVIDER_SITE_OTHER): Payer: Medicare Other | Admitting: Family Medicine

## 2022-12-02 ENCOUNTER — Encounter: Payer: Self-pay | Admitting: Family Medicine

## 2022-12-02 VITALS — BP 140/78 | HR 72 | Temp 98.3°F | Resp 18 | Ht 64.0 in | Wt 178.7 lb

## 2022-12-02 DIAGNOSIS — Z13220 Encounter for screening for lipoid disorders: Secondary | ICD-10-CM

## 2022-12-02 DIAGNOSIS — M545 Low back pain, unspecified: Secondary | ICD-10-CM | POA: Diagnosis not present

## 2022-12-02 DIAGNOSIS — R252 Cramp and spasm: Secondary | ICD-10-CM

## 2022-12-02 DIAGNOSIS — R32 Unspecified urinary incontinence: Secondary | ICD-10-CM

## 2022-12-02 DIAGNOSIS — Z7689 Persons encountering health services in other specified circumstances: Secondary | ICD-10-CM

## 2022-12-02 NOTE — Progress Notes (Signed)
New Patient Office Visit  Subjective    Patient ID: Brandi Moon, female    DOB: 20-Dec-1945  Age: 77 y.o. MRN: [id number]  CC:  Chief Complaint  Patient presents with  . Establish Care    Patient is here to establish care with new provider , Patient has daughter with her today, patient is refusing to speak with interpreter [phone] [facility]), Patients daughter states that patient doesn't know how to explain what she needs through interpreter, Daughter did mention patient seeing Urologist the other day, . Patient only told interpreter that she has had a back ache for 3 weeks on and off  Interpreter services in place. Explained to daughter due to legal requirements, we must use non family interpreter services today. Understands and agrees to proceed with interpreter # [phone].   HPI Brandi Moon presents to establish care with this practice. She is new to me. Daughter is present during visit.   Urinary incontinence:  Recently seen by urology for urinary incontinent and hematuria and recurrent UTI. Estrace cream prescribed and antibiotic, symptoms have improved. Continues to have incontinence.   Back pain: when sleeping for many hours, pain improves when up and moving around. Sleeping for about 7 hours at a time. Not requiring OTC for pain.   Leg cramping: increased banana intake, had elevated K+ level which has returned to normal.    Chart review: 11/24/22: [facility]. Has appointments in system with urology on 7/7 and 7/25 for further evaluation.  12/01/22: urgent care visit: hyperkalemia. BMP shows potassium return to normal at 4.5. Reports eating increased amounts of bananas due to leg cramping.  6/27: urine culture negative for bacterial growth.  6/15: urine culture > 100,000 morganella morganii, treated with Bactrim, symptoms resolved.   Outpatient Encounter Medications as of 12/02/2022  Medication Sig  . sulfamethoxazole-trimethoprim (BACTRIM) 400-80 MG tablet Take  1 tablet by mouth 2 (two) times daily.  . [DISCONTINUED] atorvastatin (LIPITOR) 20 MG tablet Take 1 tablet (20 mg total) by mouth daily at 6 PM. (Patient not taking: Reported on 11/09/2017)  . [DISCONTINUED] estradiol (ESTRACE) 0.1 MG/GM vaginal cream Apply 3x weekly as directed using a pea sized amount on finger tip  . [DISCONTINUED] furosemide (LASIX) 40 MG tablet Take 1 tablet (40 mg total) by mouth in the morning for 5 days.  . [DISCONTINUED] metoprolol succinate (TOPROL-XL) 25 MG 24 hr tablet Take 1 tablet (25 mg total) by mouth daily. Take with or immediately following a meal.   No facility-administered encounter medications on file as of 12/02/2022.    Past Medical History:  Diagnosis Date  . Arthritis   . Cancer (HCC)    uterine  . Leg cramping     Past Surgical History:  Procedure Laterality Date  . ABDOMINAL HYSTERECTOMY    . histerectomy      Family History  Problem Relation Age of Onset  . COPD Mother   . Heart attack Father     Social History   Socioeconomic History  . Marital status: Widowed    Spouse name: Not on file  . Number of children: Not on file  . Years of education: Not on file  . Highest education level: Not on file  Occupational History  . Not on file  Tobacco Use  . Smoking status: Never  . Smokeless tobacco: Never  Substance and Sexual Activity  . Alcohol use: No  . Drug use: No  . Sexual activity: Not on file  Other  Topics Concern  . Not on file  Social History Narrative  . Not on file   Social Determinants of Health   Financial Resource Strain: Not on file  Food Insecurity: Not on file  Transportation Needs: Not on file  Physical Activity: Not on file  Stress: Not on file  Social Connections: Not on file  Intimate Partner Violence: Not on file    Review of Systems  Constitutional:  Negative for chills, fever, malaise/fatigue and weight loss.  Eyes:  Negative for blurred vision and double vision.  Respiratory:  Negative for  shortness of breath.   Cardiovascular:  Positive for leg swelling (intermittent, resolves on its own). Negative for chest pain.  Gastrointestinal:  Negative for nausea and vomiting.  Genitourinary:  Negative for dysuria, frequency and urgency.       Under the care of urology.   Musculoskeletal:  Positive for back pain (resolves once out of bed. Does not require OTC medications.).  Neurological:  Negative for dizziness and headaches.  Psychiatric/Behavioral:  Negative for depression and suicidal ideas.         Objective    BP (!) 140/78 (BP Location: Right Arm, Patient Position: Sitting, Cuff Size: Normal)   Pulse 72   Temp 98.3 F (36.8 C) (Oral)   Resp 18   Ht 5\' 4"  (1.626 m)   Wt 178 lb 11.2 oz (81.1 kg)   SpO2 99%   BMI 30.67 kg/m   Physical Exam Vitals and nursing note reviewed.  Constitutional:      General: She is not in acute distress.    Appearance: Normal appearance.  Cardiovascular:     Rate and Rhythm: Normal rate and regular rhythm.     Pulses: Normal pulses.     Heart sounds: Normal heart sounds.  Pulmonary:     Effort: Pulmonary effort is normal.     Breath sounds: Normal breath sounds.  Musculoskeletal:     Right lower leg: No edema.     Left lower leg: No edema.  Skin:    General: Skin is warm and dry.     Capillary Refill: Capillary refill takes less than 2 seconds.  Neurological:     General: No focal deficit present.     Mental Status: She is alert. Mental status is at baseline.  Psychiatric:        Mood and Affect: Mood normal.        Behavior: Behavior normal.        Thought Content: Thought content normal.        Judgment: Judgment normal.       Assessment & Plan:   Problem List Items Addressed This Visit     Encounter for lipid screening for cardiovascular disease - Primary   Relevant Orders   Lipid panel   Urinary incontinence  Under the care of urology. Appointments scheduled in July for further evaluation of hematuria and  recurrent infections.    Low back pain  Resolves quickly after getting out of bed in the morning. Will continue to monitor, no interventions needed today as pain resolves on its own without medications.    Leg cramping  Potassium levels stable. Will continue to monitor.   Agrees with plan of care discussed.  Questions answered.   Return for will schedule medicare wellness on My Chart.   Novella Olive, FNP

## 2022-12-04 ENCOUNTER — Ambulatory Visit (HOSPITAL_BASED_OUTPATIENT_CLINIC_OR_DEPARTMENT_OTHER)
Admission: RE | Admit: 2022-12-04 | Discharge: 2022-12-04 | Disposition: A | Discharge status: Home / Self Care | Payer: Medicare Other | Source: Ambulatory Visit | Attending: Urology | Admitting: Urology

## 2022-12-04 DIAGNOSIS — R3129 Other microscopic hematuria: Secondary | ICD-10-CM | POA: Insufficient documentation

## 2022-12-04 MED ORDER — IOHEXOL 300 MG/ML  SOLN
125.0000 mL | Freq: Once | INTRAMUSCULAR | Status: AC | PRN
  Administered 2022-12-04: 125 mL via INTRAVENOUS

## 2022-12-22 ENCOUNTER — Ambulatory Visit: Payer: Medicare Other | Admitting: Urology

## 2022-12-22 ENCOUNTER — Encounter: Payer: Self-pay | Admitting: Urology

## 2022-12-22 VITALS — BP 149/72 | HR 77

## 2022-12-22 DIAGNOSIS — N362 Urethral caruncle: Secondary | ICD-10-CM | POA: Diagnosis not present

## 2022-12-22 DIAGNOSIS — R3129 Other microscopic hematuria: Secondary | ICD-10-CM

## 2022-12-22 DIAGNOSIS — N3281 Overactive bladder: Secondary | ICD-10-CM

## 2022-12-22 DIAGNOSIS — N390 Urinary tract infection, site not specified: Secondary | ICD-10-CM

## 2022-12-22 DIAGNOSIS — N3942 Incontinence without sensory awareness: Secondary | ICD-10-CM | POA: Diagnosis not present

## 2022-12-22 DIAGNOSIS — Z87440 Personal history of urinary (tract) infections: Secondary | ICD-10-CM

## 2022-12-22 LAB — URINALYSIS, ROUTINE W REFLEX MICROSCOPIC
Bilirubin, UA: NEGATIVE
Glucose, UA: NEGATIVE
Ketones, UA: NEGATIVE
Leukocytes,UA: NEGATIVE
Nitrite, UA: NEGATIVE
Protein,UA: NEGATIVE
Specific Gravity, UA: 1.02 (ref 1.005–1.030)
Urobilinogen, Ur: 0.2 mg/dL (ref 0.2–1.0)
pH, UA: 5.5 (ref 5.0–7.5)

## 2022-12-22 LAB — MICROSCOPIC EXAMINATION: WBC, UA: NONE SEEN /hpf (ref 0–5)

## 2022-12-22 MED ORDER — SULFAMETHOXAZOLE-TRIMETHOPRIM 400-80 MG PO TABS: 1.0000 | ORAL_TABLET | Freq: Every evening | ORAL | 1 refills | Status: DC

## 2022-12-22 NOTE — Progress Notes (Signed)
   Assessment: 1. Recurrent UTI   2. Other microscopic hematuria   3. 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 OAB (overactive bladder)   5. Urethral caruncle     Plan: Continue UTI prevention measures including low-dose Bactrim prophylaxis, vaginal estrogen cream, and urinary probiotic.  Follow-up 3 to 4 months for recheck-sooner if problems arise  Chief Complaint: Recurrent UTI  HPI: Brandi Moon is a 77 y.o. female who presents for continued evaluation of a number of urologic issues including recurrent urinary tract infection, microscopic hematuria, urinary incontinence, and OAB symptoms. Here today for female exam.  Cysto not done.  UA neg. On BMP obtained prior to CT heme protocol patient was noted to have elevated potassium which was when repeated still moderately elevated.  She had apparently been eating a lot of bananas recently.  Repeat in urgent care was down at 4.3.  At the time of her initial visit we started UTI prevention measures including using a probiotic and vaginal estrogen cream.  She is also using nightly regular strength Bactrim low-dose prophylaxis.  Since that time she has improved significantly with much fewer OAB lower urinary tract symptoms and less leakage episodes.  She has had no UTI symptoms.  CT stone study 12/04/2022 personally reviewed by me today-- IMPRESSION: 1. No explanation for hematuria. No nephrolithiasis, ureterolithiasis, enhancing renal cortical lesion, or filling defects within the collecting systems. 2. 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 3. No bladder stones or filling defects in the bladder which does not excluded a bladder lesion.  Portions of the above documentation were copied from a prior visit for review purposes only.  Allergies: No Known Allergies  PMH: Past Medical History:  Diagnosis Date   Arthritis    Cancer  (HCC)    uterine   Leg cramping     PSH: Past Surgical History:  Procedure Laterality Date   ABDOMINAL HYSTERECTOMY     histerectomy      SH: Social History   Tobacco Use   Smoking status: Never   Smokeless tobacco: Never  Substance Use Topics   Alcohol use: No   Drug use: No    ROS: Constitutional:  Negative for fever, chills, weight loss CV: Negative for chest pain, previous MI, hypertension Respiratory:  Negative for shortness of breath, wheezing, sleep apnea, frequent cough GI:  Negative for nausea, vomiting, bloody stool, GERD  PE: BP (!) 149/72   Pulse 77  GENERAL APPEARANCE:  Well appearing, well developed, well nourished, NAD  GU:  marked atrophic vaginitis and small urethral caruncle   Results: UA neg for blood or infection

## 2023-01-03 ENCOUNTER — Ambulatory Visit (INDEPENDENT_AMBULATORY_CARE_PROVIDER_SITE_OTHER): Payer: Medicare Other | Admitting: Medical

## 2023-01-03 VITALS — BP 126/50 | HR 76 | Resp 18 | Ht 64.0 in | Wt 179.0 lb

## 2023-01-03 DIAGNOSIS — Z8541 Personal history of malignant neoplasm of cervix uteri: Secondary | ICD-10-CM

## 2023-01-03 DIAGNOSIS — Z87440 Personal history of urinary (tract) infections: Secondary | ICD-10-CM

## 2023-01-03 DIAGNOSIS — R7989 Other specified abnormal findings of blood chemistry: Secondary | ICD-10-CM

## 2023-01-03 DIAGNOSIS — E785 Hyperlipidemia, unspecified: Secondary | ICD-10-CM

## 2023-01-03 NOTE — Patient Instructions (Addendum)
1. Hyperlipidemia, unspecified hyperlipidemia type Future fasting labs to get scheduled in a week or sooner - Comp Met (CMET); Future - Lipid panel; Future  2. History of cervical cancer Refer to gyn based on hx and to evaluate if gyn cause contributing to urinary infections. - Ambulatory referral to Gynecology  3. History of UTI Continue low dose bactrim per urologist and follow up with urolgost as planned.  Explained tdap, shingrix, flu, pneumonia and covid are vaccines to consider. Can get thru pharmacy. Presently you are declining.  Follow up date with me to be determined after lab review.

## 2023-01-03 NOTE — Progress Notes (Signed)
Subjective:    Patient ID: CECI RAVENS, female    DOB: 1945-09-07, 77 y.o.   MRN: [id number]  HPI  Pt in for first time.  Pt from [place] Pt states eats healthy. Some mild walking around house. No hx of smoking. Non smoker. Drinks 2-3 cups coffee a day.  Pt has been seeing urologist   Assessment: 1. Recurrent UTI   2. Other microscopic hematuria   3. 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 OAB (overactive bladder)   5. Urethral caruncle       Plan: Continue UTI prevention measures including low-dose Bactrim prophylaxis, vaginal estrogen cream, and urinary probiotic.  Follow-up 3 to 4 months for recheck-sooner if problems arise.   CT stone study 12/04/2022 personally reviewed by me today-- IMPRESSION: 1. No explanation for hematuria. No nephrolithiasis, ureterolithiasis, enhancing renal cortical lesion, or filling defects within the collecting systems. 2. 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 3. No bladder stones or filling defects in the bladder which does not excluded a bladder lesion.   Pt was given bp med in past but she never took. Sometimes can be mild elevated. But today well controlled.  5 years ago her cholesterol was elevated. Has never used cholesterol meds.  Pt indicates does not want to get any vaccines. Explained tdap, shingrix, flu, pneumonia and covid are ones to consider.  More than 30 years since has mammogram Pt states cancer 1992. She states did not get treatment lord healed per her report. Pt daughter wants her to get repeat pap.  Pt never had colonscopy. Pt not interested in doing the study.    Review of Systems  Constitutional:  Negative for chills and fatigue.  HENT:  Negative for congestion.   Respiratory:  Negative for cough, chest tightness, shortness of breath and wheezing.   Cardiovascular:  Negative for chest pain and  palpitations.  Gastrointestinal:  Negative for abdominal pain, blood in stool, nausea and vomiting.  Genitourinary:  Negative for dysuria, flank pain and urgency.       Improved symptoms with bactrim.  Daughter indicates urologist wants her to see gyn as well.  Musculoskeletal:  Negative for back pain.  Skin:  Negative for rash.  Neurological:  Negative for dizziness, speech difficulty and light-headedness.  Hematological:  Negative for adenopathy. Does not bruise/bleed easily.  Psychiatric/Behavioral:  Negative for behavioral problems and dysphoric mood.    Past Medical History:  Diagnosis Date   Arthritis    Cancer (HCC)    uterine   Leg cramping      Social History   Socioeconomic History   Marital status: Widowed    Spouse name: Not on file   Number of children: Not on file   Years of education: Not on file   Highest education level: Not on file  Occupational History   Not on file  Tobacco Use   Smoking status: Never   Smokeless tobacco: Never  Substance and Sexual Activity   Alcohol use: No   Drug use: No   Sexual activity: Not on file  Other Topics Concern   Not on file  Social History Narrative   Not on file   Social Determinants of Health   Financial Resource Strain: Not on file  Food Insecurity: Not on file  Transportation Needs: Not on file  Physical Activity: Not on file  Stress: Not on file  Social Connections: Not on file  Intimate Partner Violence: Not on file    Past Surgical History:  Procedure Laterality Date   ABDOMINAL HYSTERECTOMY     histerectomy      Family History  Problem Relation Age of Onset   COPD Mother    Heart attack Father     No Known Allergies  Current Outpatient Medications on File Prior to Visit  Medication Sig Dispense Refill   [DISCONTINUED] atorvastatin (LIPITOR) 20 MG tablet Take 1 tablet (20 mg total) by mouth daily at 6 PM. (Patient not taking: Reported on 11/09/2017) 30 tablet 2   No current  facility-administered medications on file prior to visit.    BP (!) 126/50   Pulse 76   Resp 18   Ht 5\' 4"  (1.626 m)   Wt 179 lb (81.2 kg)   SpO2 97%   BMI 30.73 kg/m        Objective:   Physical Exam  General Mental Status- Alert. General Appearance- Not in acute distress.   Skin General: Color- Normal Color. Moisture- Normal Moisture.  Neck Carotid Arteries- Normal color. Moisture- Normal Moisture. No carotid bruits. No JVD.  Chest and Lung Exam Auscultation: Breath Sounds:-Normal.  Cardiovascular Auscultation:Rythm- Regular. Murmurs & Other Heart Sounds:Auscultation of the heart reveals- No Murmurs.  Abdomen Inspection:-Inspeection Normal. Palpation/Percussion:Note:No mass. Palpation and Percussion of the abdomen reveal- Non Tender, Non Distended + BS, no rebound or guarding.   Neurologic Cranial Nerve exam:- CN III-XII intact(No nystagmus), symmetric smile. Strength:- 5/5 equal and symmetric strength both upper and lower extremities.       Assessment & Plan:  1. Hyperlipidemia, unspecified hyperlipidemia type Future fasting labs to get scheduled in a week or sooner - Comp Met (CMET); Future - Lipid panel; Future  2. History of cervical cancer Refer to gyn based on hx and to evaluate if gyn cause contributing to urinary infections. - Ambulatory referral to Gynecology  3. History of UTI Continue low dose bactrim per urologist and follow up with urolgost as planned.  Explained tdap, shingrix, flu, pneumonia and covid are vaccines to consider. Can get thru pharmacy. Presently you are declining.  Follow up date with me to be determined after lab review. Esperanza Richters, PA-C

## 2023-01-10 ENCOUNTER — Other Ambulatory Visit: Payer: Medicare Other

## 2023-02-20 ENCOUNTER — Other Ambulatory Visit (INDEPENDENT_AMBULATORY_CARE_PROVIDER_SITE_OTHER): Payer: Medicare Other

## 2023-02-20 DIAGNOSIS — E785 Hyperlipidemia, unspecified: Secondary | ICD-10-CM

## 2023-02-20 DIAGNOSIS — R7989 Other specified abnormal findings of blood chemistry: Secondary | ICD-10-CM | POA: Diagnosis not present

## 2023-02-20 LAB — COMPREHENSIVE METABOLIC PANEL
ALT: 11 U/L (ref 0–35)
AST: 14 U/L (ref 0–37)
Albumin: 3.5 g/dL (ref 3.5–5.2)
Alkaline Phosphatase: 76 U/L (ref 39–117)
BUN: 14 mg/dL (ref 6–23)
CO2: 29 mEq/L (ref 19–32)
Calcium: 9.1 mg/dL (ref 8.4–10.5)
Chloride: 106 mEq/L (ref 96–112)
Creatinine, Ser: 0.85 mg/dL (ref 0.40–1.20)
GFR: 66.17 mL/min (ref >60.00)
Glucose, Bld: 93 mg/dL (ref 70–99)
Potassium: 4.4 mEq/L (ref 3.5–5.1)
Sodium: 142 mEq/L (ref 135–145)
Total Bilirubin: 0.6 mg/dL (ref 0.2–1.2)
Total Protein: 6.3 g/dL (ref 6.0–8.3)

## 2023-02-20 LAB — CBC WITH DIFFERENTIAL/PLATELET
Basophils Absolute: 0 10*3/uL (ref 0.0–0.1)
Basophils Relative: 0.5 % (ref 0.0–3.0)
Eosinophils Absolute: 0.2 10*3/uL (ref 0.0–0.7)
Eosinophils Relative: 2.1 % (ref 0.0–5.0)
HCT: 38.1 % (ref 36.0–46.0)
Hemoglobin: 12.2 g/dL (ref 12.0–15.0)
Lymphocytes Relative: 37.4 % (ref 12.0–46.0)
Lymphs Abs: 3.3 10*3/uL (ref 0.7–4.0)
MCHC: 31.9 g/dL (ref 30.0–36.0)
MCV: 84.1 fl (ref 78.0–100.0)
Monocytes Absolute: 0.8 10*3/uL (ref 0.1–1.0)
Monocytes Relative: 8.7 % (ref 3.0–12.0)
Neutro Abs: 4.5 10*3/uL (ref 1.4–7.7)
Neutrophils Relative %: 51.3 % (ref 43.0–77.0)
Platelets: 276 10*3/uL (ref 150.0–400.0)
RBC: 4.52 Mil/uL (ref 3.87–5.11)
RDW: 15.4 % (ref 11.5–15.5)
WBC: 8.9 10*3/uL (ref 4.0–10.5)

## 2023-02-20 LAB — LIPID PANEL
Cholesterol: 208 mg/dL — ABNORMAL HIGH (ref 0–200)
HDL: 76.8 mg/dL (ref >39.00)
LDL Cholesterol: 118 mg/dL — ABNORMAL HIGH (ref 0–99)
NonHDL: 131.22
Total CHOL/HDL Ratio: 3
Triglycerides: 65 mg/dL (ref 0.0–149.0)
VLDL: 13 mg/dL (ref 0.0–40.0)

## 2023-02-22 ENCOUNTER — Encounter: Payer: Self-pay | Admitting: Urology

## 2023-02-22 ENCOUNTER — Ambulatory Visit: Payer: Medicare Other | Admitting: Urology

## 2023-02-22 VITALS — BP 147/61 | HR 77

## 2023-02-22 DIAGNOSIS — R3129 Other microscopic hematuria: Secondary | ICD-10-CM | POA: Diagnosis not present

## 2023-02-22 DIAGNOSIS — Z87440 Personal history of urinary (tract) infections: Secondary | ICD-10-CM | POA: Diagnosis not present

## 2023-02-22 DIAGNOSIS — N390 Urinary tract infection, site not specified: Secondary | ICD-10-CM

## 2023-02-22 NOTE — Progress Notes (Signed)
   Assessment: 1. Recurrent UTI   2. Other microscopic hematuria     Plan: Patient will continue UTI prevention measures including vaginal estrogen cream  Today also discussed with her her persistent microscopic hematuria despite resolution of her UTIs.  She has had upper tract imaging but I have recommended further hematuria evaluation with hematuria profile sent today and follow-up for cystoscopy.  Rationale reviewed with patient today she agrees to proceed.  Schedule next available.  Chief Complaint: Recurrent UTI  HPI: Brandi Moon is a 77 y.o. female who presents for continued evaluation of recurrent UTI. See my note 11/24/2022 at the time of initial visit for detailed history and exam. At the time of her initial visit we started UTI prevention measures including using a probiotic and vaginal estrogen cream.  She is no longer using nightly regular strength Bactrim low-dose prophylaxis.  Since that time she has improved significantly with much fewer OAB lower urinary tract symptoms and less leakage episodes.  She has had no UTI symptoms.  UA today is negative for infection but does show persistent microscopic hematuria   CT stone study 12/04/2022 personally reviewed by me today-- IMPRESSION: 1. No explanation for hematuria. No nephrolithiasis, ureterolithiasis, enhancing renal cortical lesion, or filling defects within the collecting systems. 2. 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 3. No bladder stones or filling defects in the bladder which does not excluded a bladder lesion.  Portions of the above documentation were copied from a prior visit for review purposes only.  Allergies: No Known Allergies  PMH: Past Medical History:  Diagnosis Date   Arthritis    Cancer (HCC)    uterine   Leg cramping     PSH: Past Surgical History:  Procedure Laterality Date   ABDOMINAL  HYSTERECTOMY     histerectomy      SH: Social History   Tobacco Use   Smoking status: Never   Smokeless tobacco: Never  Substance Use Topics   Alcohol use: No   Drug use: No    ROS: Constitutional:  Negative for fever, chills, weight loss CV: Negative for chest pain, previous MI, hypertension Respiratory:  Negative for shortness of breath, wheezing, sleep apnea, frequent cough GI:  Negative for nausea, vomiting, bloody stool, GERD  PE: BP (!) 147/61   Pulse 77  GENERAL APPEARANCE:  Well appearing, well developed, well nourished, NAD    Results: UA negative for infection but does show persistent microscopic hematuria

## 2023-02-27 LAB — URINALYSIS, ROUTINE W REFLEX MICROSCOPIC
Bilirubin, UA: NEGATIVE
Glucose, UA: NEGATIVE
Ketones, UA: NEGATIVE
Nitrite, UA: NEGATIVE
Protein,UA: NEGATIVE
Specific Gravity, UA: 1.02 (ref 1.005–1.030)
Urobilinogen, Ur: 0.2 mg/dL (ref 0.2–1.0)
pH, UA: 6 (ref 5.0–7.5)

## 2023-02-27 LAB — MICROSCOPIC EXAMINATION

## 2023-03-06 ENCOUNTER — Encounter: Payer: Self-pay | Admitting: Urology

## 2023-03-08 ENCOUNTER — Ambulatory Visit: Payer: Medicare Other | Admitting: Urology

## 2023-03-08 VITALS — BP 154/81 | HR 78

## 2023-03-08 DIAGNOSIS — R3129 Other microscopic hematuria: Secondary | ICD-10-CM

## 2023-03-08 DIAGNOSIS — N3281 Overactive bladder: Secondary | ICD-10-CM

## 2023-03-08 DIAGNOSIS — N390 Urinary tract infection, site not specified: Secondary | ICD-10-CM

## 2023-03-08 DIAGNOSIS — Z87440 Personal history of urinary (tract) infections: Secondary | ICD-10-CM

## 2023-03-08 MED ORDER — SULFAMETHOXAZOLE-TRIMETHOPRIM 800-160 MG PO TABS
1.0000 | ORAL_TABLET | Freq: Once | ORAL | Status: AC
Start: 2023-03-08 — End: 2023-03-08
  Administered 2023-03-08: 1 via ORAL

## 2023-03-08 MED ORDER — SOLIFENACIN SUCCINATE 10 MG PO TABS: 5.0000 mg | ORAL_TABLET | Freq: Every day | ORAL | 3 refills | Status: DC

## 2023-03-08 NOTE — Progress Notes (Addendum)
   Assessment: 1. Recurrent UTI   2. Other microscopic hematuria   3. 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  Following our discussion she would like to begin new medical therapy. Rx: Vesicare 5 mg daily Nature of medication including proper utilization as well as potential adverse events and side effects reviewed.  FU 55mo  Chief Complaint: Blood in urine  HPI: Brandi Moon is a 77 y.o. female who presents for continued evaluation of recurrent urinary tract infection.  At the time of her initial visit we started UTI prevention measures including using a probiotic and vaginal estrogen cream.  She is no longer using nightly regular strength Bactrim low-dose prophylaxis.   Patient reports no UTI symptoms.  She does still have some significant OAB type symptoms including urge incontinence and does wear a adult diaper during the day.  She has had persistent microscopic hematuria despite controlling her UTIs.  She has had negative upper tract imaging and today cysto is normal.  UA today shows greater than 30 RBCs/hpf otherwise negative  Hematuria profile was negative for malignant cells or dysplasia.  Of note was the finding of dysmorphic red cells as well as RBC casts indicative of renal parenchymal bleeding. Of note is that she does have normal renal function and no evidence of proteinuria.  Portions of the above documentation were copied from a prior visit for review purposes only.  Allergies: No Known Allergies  PMH: Past Medical History:  Diagnosis Date   Arthritis    Cancer (HCC)    uterine   Leg cramping     PSH: Past Surgical History:  Procedure Laterality Date   ABDOMINAL HYSTERECTOMY     histerectomy      SH: Social History   Tobacco Use   Smoking status: Never   Smokeless tobacco: Never  Substance Use  Topics   Alcohol use: No   Drug use: No    ROS: Constitutional:  Negative for fever, chills, weight loss CV: Negative for chest pain, previous MI, hypertension Respiratory:  Negative for shortness of breath, wheezing, sleep apnea, frequent cough GI:  Negative for nausea, vomiting, bloody stool, GERD  PE: BP (!) 154/81   Pulse 78  GENERAL APPEARANCE:  Well appearing, well developed, well nourished, NAD    Results: UA today shows greater than 30 RBC/hpf otherwise negative   Procedure: Female cystoscopy  Indication: Microscopic hematuria  Description of procedure: Patient was brought to the procedure room where she was correctly identified.  The procedure was again reviewed with her and informed consent was obtained.  Preprocedural timeout was performed.  Flexible cystoscopy was subsequently performed systematically with the entire urothelium visualized.  Urethra appeared normal.  Bladder was entered and carefully visualized.  Ureteral openings appeared normal.  There were no focal mucosal lesions appreciated.  The procedure was well-tolerated.

## 2023-03-08 NOTE — Addendum Note (Signed)
Addended by: Marcha Solders C on: 03/08/2023 11:04 AM   Modules accepted: Orders, Level of Service

## 2023-03-08 NOTE — Addendum Note (Signed)
Addended by: Carolin Coy on: 03/08/2023 12:42 PM   Modules accepted: Orders

## 2023-03-09 DIAGNOSIS — R3129 Other microscopic hematuria: Secondary | ICD-10-CM | POA: Diagnosis not present

## 2023-03-09 DIAGNOSIS — N390 Urinary tract infection, site not specified: Secondary | ICD-10-CM | POA: Diagnosis not present

## 2023-03-09 LAB — MICROSCOPIC EXAMINATION
Bacteria, UA: NONE SEEN
RBC, Urine: >30 /[HPF] — ABNORMAL HIGH (ref 0–2)

## 2023-03-09 LAB — URINALYSIS, ROUTINE W REFLEX MICROSCOPIC
Bilirubin, UA: NEGATIVE
Glucose, UA: NEGATIVE
Ketones, UA: NEGATIVE
Leukocytes,UA: NEGATIVE
Nitrite, UA: NEGATIVE
Protein,UA: NEGATIVE
Specific Gravity, UA: 1.015 (ref 1.005–1.030)
Urobilinogen, Ur: 0.2 mg/dL (ref 0.2–1.0)
pH, UA: 6 (ref 5.0–7.5)

## 2023-03-14 ENCOUNTER — Ambulatory Visit (INDEPENDENT_AMBULATORY_CARE_PROVIDER_SITE_OTHER): Payer: Medicare Other | Admitting: Medical

## 2023-03-14 ENCOUNTER — Encounter: Payer: Self-pay | Admitting: Medical

## 2023-03-14 VITALS — BP 150/80 | HR 66 | Temp 98.2°F | Resp 18 | Ht 64.0 in | Wt 173.0 lb

## 2023-03-14 DIAGNOSIS — I10 Essential (primary) hypertension: Secondary | ICD-10-CM | POA: Diagnosis not present

## 2023-03-14 DIAGNOSIS — R319 Hematuria, unspecified: Secondary | ICD-10-CM

## 2023-03-14 DIAGNOSIS — E785 Hyperlipidemia, unspecified: Secondary | ICD-10-CM

## 2023-03-14 MED ORDER — ATORVASTATIN CALCIUM 10 MG PO TABS: 10.0000 mg | ORAL_TABLET | Freq: Every day | ORAL | 3 refills | Status: DC

## 2023-03-14 MED ORDER — OLMESARTAN MEDOXOMIL 5 MG PO TABS: 10.0000 mg | ORAL_TABLET | Freq: Every day | ORAL | 0 refills | Status: DC

## 2023-03-14 NOTE — Patient Instructions (Addendum)
1. Hypertension, unspecified type Bp very high initially with our machine but on 2nd and 3rd check moderate high. Rx benicar 10 mg daily. Call you insurance to see if they will give you machine to check bp. I will also give you hand written rx as well. Check bp daily. Follow up in 2 weeks for bp level review.  2. Hyperlipidemia, unspecified hyperlipidemia type -Reviewed 10 year risk score. Benefits vs risk of med discussed. Rx atorvastatin.  3. Hematuria, unspecified type -cystoscpy was negative. Urologist referred you to nephrology.Please call to schedule appointment. - [phone].  Follow up in 2 weeks or sooner if needed

## 2023-03-14 NOTE — Progress Notes (Signed)
Subjective:    Patient ID: Brandi Moon, female    DOB: [birth year], 77 y.o.   MRN: [id number]  HPI Pt in for followup.  Last visit AVS.  "1. Hyperlipidemia, unspecified hyperlipidemia type Future fasting labs to get scheduled in a week or sooner - Comp Met (CMET); Future - Lipid panel; Future   2. History of cervical cancer Refer to gyn based on hx and to evaluate if gyn cause contributing to urinary infections. - Ambulatory referral to Gynecology   3. History of UTI Continue low dose bactrim per urologist and follow up with urolgost as planned."  Referred to urologist and pt did go to the appointment.   Expand All Collapse All    Assessment: 1. Recurrent UTI   2. Other microscopic hematuria       Plan: Patient will continue UTI prevention measures including vaginal estrogen cream   Today also discussed with her her persistent microscopic hematuria despite resolution of her UTIs.  She has had upper tract imaging but I have recommended further hematuria evaluation with hematuria profile sent today and follow-up for cystoscopy.  Rationale reviewed with patient today she agrees to proceed.  Schedule next available.      CT stone study 12/04/2022 personally reviewed by me today-- IMPRESSION: 1. No explanation for hematuria. No nephrolithiasis, ureterolithiasis, enhancing renal cortical lesion, or filling defects within the collecting systems. 2. 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 3. No bladder stones or filling defects in the bladder which does not excluded a bladder lesion.    Recurrent UTI    2. Other microscopic hematuria   3. OAB (overactive bladder)        Post cystoscopy plan below.  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  Following our  discussion she would like to begin new medical therapy. Rx: Vesicare 5 mg daily Nature of medication including proper utilization as well as potential adverse events and side effects reviewed.  Pt was referred to nephology but appointment pending.  Referral faxed to [phone] Phone [phone]  Pt bp as elevated mildly at urologist and higher here initially. 2 month ago bp was good.   Pt has high cholesterol will rx atorvastatin 10 mg. Discussed level. Risk vs benefit today.    Review of Systems  Constitutional:  Negative for chills, fatigue and fever.  HENT:  Negative for congestion.   Respiratory:  Negative for cough, chest tightness, shortness of breath and wheezing.   Cardiovascular:  Negative for chest pain and palpitations.  Gastrointestinal:  Negative for abdominal pain and blood in stool.  Genitourinary:  Negative for dysuria and flank pain.  Neurological:  Negative for dizziness, speech difficulty and light-headedness.  Hematological:  Negative for adenopathy. Does not bruise/bleed easily.  Psychiatric/Behavioral:  Negative for behavioral problems and confusion.     Past Medical History:  Diagnosis Date   Arthritis    Cancer (HCC)    uterine   Leg cramping      Social History   Socioeconomic History   Marital status: Widowed    Spouse name: Not on file   Number of children: Not on file   Years of education: Not on file   Highest education level: Not on file  Occupational History   Not on file  Tobacco Use   Smoking status: Never   Smokeless tobacco: Never  Substance and Sexual Activity   Alcohol use: No   Drug use: No   Sexual activity: Not on file  Other Topics Concern   Not on file  Social History Narrative   Not on file   Social Determinants of Health   Financial Resource Strain: Not on file  Food Insecurity: Not on file  Transportation Needs: Not on file  Physical Activity: Not on file  Stress: Not on file  Social Connections: Not on  file  Intimate Partner Violence: Not on file    Past Surgical History:  Procedure Laterality Date   ABDOMINAL HYSTERECTOMY     histerectomy      Family History  Problem Relation Age of Onset   COPD Mother    Heart attack Father     No Known Allergies  Current Outpatient Medications on File Prior to Visit  Medication Sig Dispense Refill   solifenacin (VESICARE) 10 MG tablet Take 0.5 tablets (5 mg total) by mouth daily. 90 tablet 3   No current facility-administered medications on file prior to visit.    BP (!) 150/80   Pulse 66   Temp 98.2 F (36.8 C)   Resp 18   Ht 5\' 4"  (1.626 m)   Wt 173 lb (78.5 kg)   SpO2 100%   BMI 29.70 kg/m        Objective:   Physical Exam  General Mental Status- Alert. General Appearance- Not in acute distress.   Skin General: Color- Normal Color. Moisture- Normal Moisture.  Neck Carotid Arteries- Normal color. Moisture- Normal Moisture. No carotid bruits. No JVD.  Chest and Lung Exam Auscultation: Breath Sounds:-Normal.  Cardiovascular Auscultation:Rythm- Regular. Murmurs & Other Heart Sounds:Auscultation of the heart reveals- No Murmurs.   Neurologic Cranial Nerve exam:- CN III-XII intact(No nystagmus), symmetric smile. Strength:- 5/5 equal and symmetric strength both upper and lower extremities.       Assessment & Plan:   Patient Instructions  1. Hypertension, unspecified type Bp very high initially with our machine but on 2nd and 3rd check moderate high. Rx benicar 10 mg daily. Call you insurance to see if they will give you machine to check bp. I will also give you hand written rx as well. Check bp daily. Follow up in 2 weeks for bp level review.  2. Hyperlipidemia, unspecified hyperlipidemia type -Reviewed 10 year risk score. Benefits vs risk of med discussed. Rx atorvastatin.  3. Hematuria, unspecified type -cystoscpy was negative. Urologist referred you to nephrology.Please call to schedule appointment. -  [phone].  Follow up in 2 weeks or sooner if needed     [employer], PA-C

## 2023-03-28 ENCOUNTER — Ambulatory Visit (INDEPENDENT_AMBULATORY_CARE_PROVIDER_SITE_OTHER): Payer: Medicare Other | Admitting: Medical

## 2023-03-28 ENCOUNTER — Encounter: Payer: Self-pay | Admitting: Medical

## 2023-03-28 VITALS — BP 148/78 | HR 71 | Resp 18 | Ht 64.0 in | Wt 172.0 lb

## 2023-03-28 DIAGNOSIS — I10 Essential (primary) hypertension: Secondary | ICD-10-CM

## 2023-03-28 DIAGNOSIS — R5383 Other fatigue: Secondary | ICD-10-CM | POA: Diagnosis not present

## 2023-03-28 MED ORDER — OLMESARTAN MEDOXOMIL 20 MG PO TABS: 20.0000 mg | ORAL_TABLET | Freq: Every day | ORAL | 0 refills | Status: DC

## 2023-03-28 NOTE — Patient Instructions (Addendum)
1. Hypertension, unspecified type -bp still high. Repeat checks above 140/90. Benicar 20 mg daily.   2. Fatigue, unspecified type -recent cbc and cmp no cause found one month ago. Will get additional labs. If you thinks vesicare caused side effect fatigue ask you contact urologist office to ask which med they would replace with other. - TSH - B12 - Vitamin B1 - Iron   Follow up in 2 weeks or sooner if needed.

## 2023-03-28 NOTE — Progress Notes (Signed)
Subjective:    Patient ID: Brandi Moon, female    DOB: Jul 17, 1945, 77 y.o.   MRN: [id number]  HPI Pt in for follow up.  Last visit AVS below in "  "1. Hypertension, unspecified type Bp very high initially with our machine but on 2nd and 3rd check moderate high. Rx benicar 10 mg daily. Call you insurance to see if they will give you machine to check bp. I will also give you hand written rx as well. Check bp daily. Follow up in 2 weeks for bp level review.   2. Hyperlipidemia, unspecified hyperlipidemia type -Reviewed 10 year risk score. Benefits vs risk of med discussed. Rx atorvastatin.   3. Hematuria, unspecified type -cystoscpy was negative. Urologist referred you to nephrology.Please call to schedule appointment. - [phone]."   Pt has not been checking her bp at home. No machine. Pt has no ha, no nausea, no vomiting, no chest pain or any motor/sensory deficits.   Pt thinks since started benicar    Review of Systems  Constitutional:  Positive for fatigue. Negative for chills and fever.       Over past 2 weeks. Past cbc and cmp done last month.  Respiratory:  Negative for chest tightness, shortness of breath and wheezing.   Cardiovascular:  Negative for chest pain and palpitations.  Gastrointestinal:  Negative for abdominal pain and blood in stool.  Musculoskeletal:  Negative for back pain and neck pain.  Hematological:  Negative for adenopathy.  Psychiatric/Behavioral:  Negative for behavioral problems and decreased concentration.    Past Medical History:  Diagnosis Date   Arthritis    Cancer (HCC)    uterine   Leg cramping      Social History   Socioeconomic History   Marital status: Widowed    Spouse name: Not on file   Number of children: Not on file   Years of education: Not on file   Highest education level: Not on file  Occupational History   Not on file  Tobacco Use   Smoking status: Never   Smokeless tobacco: Never  Substance and Sexual  Activity   Alcohol use: No   Drug use: No   Sexual activity: Not on file  Other Topics Concern   Not on file  Social History Narrative   Not on file   Social Determinants of Health   Financial Resource Strain: Not on file  Food Insecurity: Not on file  Transportation Needs: Not on file  Physical Activity: Not on file  Stress: Not on file  Social Connections: Not on file  Intimate Partner Violence: Not on file    Past Surgical History:  Procedure Laterality Date   ABDOMINAL HYSTERECTOMY     histerectomy      Family History  Problem Relation Age of Onset   COPD Mother    Heart attack Father     No Known Allergies  Current Outpatient Medications on File Prior to Visit  Medication Sig Dispense Refill   atorvastatin (LIPITOR) 10 MG tablet Take 1 tablet (10 mg total) by mouth daily. 90 tablet 3   olmesartan (BENICAR) 5 MG tablet Take 2 tablets (10 mg total) by mouth daily. 60 tablet 0   solifenacin (VESICARE) 10 MG tablet Take 0.5 tablets (5 mg total) by mouth daily. 90 tablet 3   No current facility-administered medications on file prior to visit.    BP (!) 148/78   Pulse 71   Resp 18   Ht 5\' 4"  (  1.626 m)   Wt 172 lb (78 kg)   SpO2 98%   BMI 29.52 kg/m        Objective:   Physical Exam  General Mental Status- Alert. General Appearance- Not in acute distress.   Skin General: Color- Normal Color. Moisture- Normal Moisture.  Neck Carotid Arteries- Normal color. Moisture- Normal Moisture. No carotid bruits. No JVD.  Chest and Lung Exam Auscultation: Breath Sounds:-Normal.  Cardiovascular Auscultation:Rythm- Regular. Murmurs & Other Heart Sounds:Auscultation of the heart reveals- No Murmurs.  Abdomen Inspection:-Inspeection Normal. Palpation/Percussion:Note:No mass. Palpation and Percussion of the abdomen reveal- Non Tender, Non Distended + BS, no rebound or guarding.    Neurologic Cranial Nerve exam:- CN III-XII intact(No nystagmus), symmetric  smile. Drift Test:- No drift. Romberg Exam:- Negative.  Heal to Toe Gait exam:-Normal. Finger to Nose:- Normal/Intact Strength:- 5/5 equal and symmetric strength both upper and lower extremities.       Assessment & Plan:   Patient Instructions  1. Hypertension, unspecified type -bp still high. Repeat checks above 140/90. Benicar 20 mg daily.   2. Fatigue, unspecified type -recent cbc and cmp no cause found one month ago. Will get additional labs. If you thinks vesicare caused side effect fatigue ask you contact urologist office to ask which med they would replace with other. - TSH - B12 - Vitamin B1 - Iron   Follow up in 2 weeks or sooner if needed.   Esperanza Richters, PA-C

## 2023-03-29 LAB — IRON: Iron: 94 ug/dL (ref 42–145)

## 2023-03-29 LAB — TSH: TSH: 2.98 u[IU]/mL (ref 0.35–5.50)

## 2023-03-29 LAB — VITAMIN B12: Vitamin B-12: 739 pg/mL (ref 211–911)

## 2023-03-31 LAB — VITAMIN B1: Vitamin B1 (Thiamine): 8 nmol/L (ref 8–30)

## 2023-04-11 ENCOUNTER — Ambulatory Visit (INDEPENDENT_AMBULATORY_CARE_PROVIDER_SITE_OTHER): Payer: Medicare Other | Admitting: Medical

## 2023-04-11 ENCOUNTER — Encounter: Payer: Self-pay | Admitting: Medical

## 2023-04-11 VITALS — BP 140/80 | HR 70 | Temp 98.0°F | Resp 18 | Ht 64.0 in | Wt 176.0 lb

## 2023-04-11 DIAGNOSIS — I10 Essential (primary) hypertension: Secondary | ICD-10-CM | POA: Diagnosis not present

## 2023-04-11 DIAGNOSIS — E785 Hyperlipidemia, unspecified: Secondary | ICD-10-CM | POA: Diagnosis not present

## 2023-04-11 NOTE — Patient Instructions (Signed)
Hypertension Blood pressure readings have been variable, with the most recent reading being 140/80 on recheck but higher initiially in office The patient is currently on Olmesartan 20mg . Recommended home blood pressure monitoring to better assess control. -Purchase home blood pressure monitor. -Report daily blood pressure readings for one week via MyChart. -in one week if bp still elevated would increase olmesartan to 40 mg daily.  Hyperlipidemia Slightly elevated cholesterol in September. Currently on Atorvastatin. -Continue Atorvastatin.  General Health Maintenance Patient declined all vaccinations, including COVID-19. -No changes to current plan.  Follow-up in 3 months if blood pressure readings are well-controlled.

## 2023-04-11 NOTE — Progress Notes (Signed)
Subjective:    Patient ID: Brandi Moon, female    DOB: 03-15-1946, 77 y.o.   MRN: [id number]  HPI Discussed the use of AI scribe software for clinical note transcription with the patient, who gave verbal consent to proceed.  History of Present Illness   The patient, with a history of hypertension, has been on Olmesartan 20mg . He reports a fluctuating blood pressure, with readings ranging from 140/90 to 154/76 over the past few visits. He has not been monitoring his blood pressure at home due to financial constraints but plans to purchase a home blood pressure monitor within the week.  In addition, the patient has been on Atorvastatin for slightly elevated cholesterol levels. He has made dietary changes, specifically reducing his sugar intake, to manage this. He has not received any vaccinations, including the COVID-19 vaccine.  The patient has not been using the MyChart application but plans to download it for better communication and to report his daily blood pressure readings for a week.        Review of Systems  Constitutional:  Negative for chills, fatigue and fever.  Respiratory:  Negative for cough, chest tightness, shortness of breath and wheezing.   Cardiovascular:  Negative for chest pain and palpitations.  Gastrointestinal:  Negative for abdominal pain.  Genitourinary:  Negative for dysuria.  Musculoskeletal:  Negative for back pain.  Skin:  Negative for rash.  Neurological:  Negative for dizziness, seizures, weakness and light-headedness.  Hematological:  Negative for adenopathy. Does not bruise/bleed easily.  Psychiatric/Behavioral:  Negative for dysphoric mood. The patient is not hyperactive.    Past Medical History:  Diagnosis Date   Arthritis    Cancer (HCC)    uterine   Leg cramping      Social History   Socioeconomic History   Marital status: Widowed    Spouse name: Not on file   Number of children: Not on file   Years of education: Not on file    Highest education level: Not on file  Occupational History   Not on file  Tobacco Use   Smoking status: Never   Smokeless tobacco: Never  Substance and Sexual Activity   Alcohol use: No   Drug use: No   Sexual activity: Not on file  Other Topics Concern   Not on file  Social History Narrative   Not on file   Social Determinants of Health   Financial Resource Strain: Not on file  Food Insecurity: Not on file  Transportation Needs: Not on file  Physical Activity: Not on file  Stress: Not on file  Social Connections: Not on file  Intimate Partner Violence: Not on file    Past Surgical History:  Procedure Laterality Date   ABDOMINAL HYSTERECTOMY     histerectomy      Family History  Problem Relation Age of Onset   COPD Mother    Heart attack Father     No Known Allergies  Current Outpatient Medications on File Prior to Visit  Medication Sig Dispense Refill   atorvastatin (LIPITOR) 10 MG tablet Take 1 tablet (10 mg total) by mouth daily. 90 tablet 3   olmesartan (BENICAR) 20 MG tablet Take 1 tablet (20 mg total) by mouth daily. 30 tablet 0   solifenacin (VESICARE) 10 MG tablet Take 0.5 tablets (5 mg total) by mouth daily. 90 tablet 3   No current facility-administered medications on file prior to visit.    BP (!) 140/80   Pulse 70  Temp 98 F (36.7 C)   Resp 18   Ht 5\' 4"  (1.626 m)   Wt 176 lb (79.8 kg)   SpO2 100%   BMI 30.21 kg/m         Objective:   Physical Exam  General Mental Status- Alert. General Appearance- Not in acute distress.   Skin General: Color- Normal Color. Moisture- Normal Moisture.  Neck Carotid Arteries- Normal color. Moisture- Normal Moisture. No carotid bruits. No JVD.  Chest and Lung Exam Auscultation: Breath Sounds:-Normal.  Cardiovascular Auscultation:Rythm- Regular. Murmurs & Other Heart Sounds:Auscultation of the heart reveals- No Murmurs.  Abdomen Inspection:-Inspeection Normal. Palpation/Percussion:Note:No  mass. Palpation and Percussion of the abdomen reveal- Non Tender, Non Distended + BS, no rebound or guarding.    Neurologic Cranial Nerve exam:- CN III-XII intact(No nystagmus), symmetric smile. Strength:- 5/5 equal and symmetric strength both upper and lower extremities.   Lower ext- no pedal edema. Calfs symmetric.    Assessment & Plan:   Assessment and Plan    Hypertension Blood pressure readings have been variable, with the most recent reading being 140/80 on recheck but higher initiially in office The patient is currently on Olmesartan 20mg . Recommended home blood pressure monitoring to better assess control. -Purchase home blood pressure monitor. -Report daily blood pressure readings for one week via MyChart. -in one week if bp still elevated would increase olmesartan to 40 mg daily.  Hyperlipidemia Slightly elevated cholesterol in September. Currently on Atorvastatin. -Continue Atorvastatin.  General Health Maintenance Patient declined all vaccinations, including COVID-19. -No changes to current plan.  Follow-up in 3 months if blood pressure readings are well-controlled.        Esperanza Richters, PA-C

## 2023-04-20 ENCOUNTER — Other Ambulatory Visit: Payer: Self-pay | Admitting: Medical

## 2023-05-16 ENCOUNTER — Telehealth: Payer: Self-pay | Admitting: Urology

## 2023-05-16 NOTE — Telephone Encounter (Signed)
SPOKE WITH THE DR. Janit Pagan OFFICE TODAY AND THEY SAID THAT THEY HAVE REACHED OUT TO THE PATIENT SEVERAL TIMES AND HAVE BEEN UNABLE TO REACH HER TO SCHEDULE. THIS REFERRAL WILL REMAIN OPEN UNTIL 11/25 IF SHE WOULD LIKE TO SCHEDULE SHE CAN CALL @ [phone].

## 2023-06-06 ENCOUNTER — Ambulatory Visit: Payer: Medicare Other | Admitting: Urology

## 2023-07-12 ENCOUNTER — Ambulatory Visit (INDEPENDENT_AMBULATORY_CARE_PROVIDER_SITE_OTHER): Payer: Medicare Other | Admitting: Medical

## 2023-07-12 ENCOUNTER — Encounter: Payer: Self-pay | Admitting: Medical

## 2023-07-12 VITALS — BP 150/80 | HR 72 | Ht 64.0 in | Wt 171.2 lb

## 2023-07-12 DIAGNOSIS — I10 Essential (primary) hypertension: Secondary | ICD-10-CM | POA: Diagnosis not present

## 2023-07-12 DIAGNOSIS — R1310 Dysphagia, unspecified: Secondary | ICD-10-CM

## 2023-07-12 DIAGNOSIS — E785 Hyperlipidemia, unspecified: Secondary | ICD-10-CM | POA: Diagnosis not present

## 2023-07-12 MED ORDER — AMLODIPINE BESYLATE 5 MG PO TABS: 5.0000 mg | ORAL_TABLET | Freq: Every day | ORAL | 0 refills | Status: DC

## 2023-07-12 NOTE — Progress Notes (Unsigned)
   Subjective:    Patient ID: Brandi Moon, female    DOB: 1945-08-19, 78 y.o.   MRN: [id number]  HPI  Discussed the use of AI scribe software for clinical note transcription with the patient, who gave verbal consent to proceed.  History of Present Illness   Brandi Moon is a 78 year old female with hypertension and hyperlipidemia who presents with difficulty swallowing. She is accompanied by her daughter.  She has been experiencing difficulty swallowing for approximately six months, characterized by a sensation of food getting stuck in her esophagus. This is sometimes exacerbated by drinking water, leading to breathlessness. These episodes occur intermittently and are not linked to any specific type of food. Her daughter, who is a Engineer, civil (consulting), has observed her difficulty swallowing during conversations and encouraged her to seek medical attention. No associated symptoms of gastritis or reflux are reported.  She has a history of hypertension and was previously taking Benicar 20 mg daily. She discontinued the medication over a month ago due to increased urinary frequency, which she felt worsened her pre-existing incontinence. Her blood pressure was recorded at 159/65 mmHg during the visit. Off benicar state urinary symptoms resolved.  She also has hyperlipidemia and is currently taking atorvastatin. Her cholesterol levels were slightly elevated four months ago. She is fasting today, having only consumed coffee with sugar, and is due for a cholesterol check.       Review of Systems See hpi    Objective:   Physical Exam  General Mental Status- Alert. General Appearance- Not in acute distress.   Skin General: Color- Normal Color. Moisture- Normal Moisture.  Neck Carotid Arteries- Normal color. Moisture- Normal Moisture. No carotid bruits. No JVD.  Chest and Lung Exam Auscultation: Breath Sounds:-Normal.  Cardiovascular Auscultation:Rythm- Regular. Murmurs & Other Heart  Sounds:Auscultation of the heart reveals- No Murmurs.  Abdomen Inspection:-Inspeection Normal. Palpation/Percussion:Note:No mass. Palpation and Percussion of the abdomen reveal- Non Tender, Non Distended + BS, no rebound or guarding.   Neurologic Cranial Nerve exam:- CN III-XII intact(No nystagmus), symmetric smile. Strength:- 5/5 equal and symmetric strength both upper and lower extremities.       Assessment & Plan:   Patient Instructions  Hypertension Elevated systolic blood pressure (159/65) on today's visit. Patient stopped taking Benicar 20mg  due to perceived increase in urinary frequency. Discussed the rarity of this side effect but acknowledged patient's experience. -Discontinue Benicar. -Start Amlodipine 5mg  daily. -Check blood pressure in 2 weeks with a nurse visit.  Dyslipidemia Patient continues to take Atorvastatin 10mg . Fasting today, so will check lipid panel. -Continue Atorvastatin 10mg  daily. -Check lipid panel today.  Dysphagia Patient reports difficulty swallowing for the past 6 months, not associated with specific types of food. No history of GERD or dyspepsia. -Refer to Gastroenterology for further evaluation, possible EGD or swallowing study.  Urinary Incontinence Patient reports pre-existing urinary incontinence, perceived to be worsened by Benicar. -No changes to current management at this time.  General Health Maintenance -Check metabolic panel today to assess renal function.   Follow up nurse2 weeks nurse bp check.  Esperanza Richters, PA-C

## 2023-07-12 NOTE — Patient Instructions (Signed)
Hypertension Elevated systolic blood pressure (159/65) on today's visit. Patient stopped taking Benicar 20mg  due to perceived increase in urinary frequency. Discussed the rarity of this side effect but acknowledged patient's experience. -Discontinue Benicar. -Start Amlodipine 5mg  daily. -Check blood pressure in 2 weeks with a nurse visit.  Dyslipidemia Patient continues to take Atorvastatin 10mg . Fasting today, so will check lipid panel. -Continue Atorvastatin 10mg  daily. -Check lipid panel today.  Dysphagia Patient reports difficulty swallowing for the past 6 months, not associated with specific types of food. No history of GERD or dyspepsia. -Refer to Gastroenterology for further evaluation, possible EGD or swallowing study.  Urinary Incontinence Patient reports pre-existing urinary incontinence, perceived to be worsened by Benicar. -No changes to current management at this time.  General Health Maintenance -Check metabolic panel today to assess renal function.  Follow up 2 months nurse bp check.

## 2023-07-13 ENCOUNTER — Encounter: Payer: Self-pay | Admitting: Medical

## 2023-07-13 LAB — COMPREHENSIVE METABOLIC PANEL
ALT: 10 U/L (ref 0–35)
AST: 14 U/L (ref 0–37)
Albumin: 3.7 g/dL (ref 3.5–5.2)
Alkaline Phosphatase: 87 U/L (ref 39–117)
BUN: 20 mg/dL (ref 6–23)
CO2: 29 meq/L (ref 19–32)
Calcium: 9.2 mg/dL (ref 8.4–10.5)
Chloride: 102 meq/L (ref 96–112)
Creatinine, Ser: 0.85 mg/dL (ref 0.40–1.20)
GFR: 65.99 mL/min (ref >60.00)
Glucose, Bld: 88 mg/dL (ref 70–99)
Potassium: 4.7 meq/L (ref 3.5–5.1)
Sodium: 139 meq/L (ref 135–145)
Total Bilirubin: 0.5 mg/dL (ref 0.2–1.2)
Total Protein: 6.8 g/dL (ref 6.0–8.3)

## 2023-07-13 LAB — LIPID PANEL
Cholesterol: 229 mg/dL — ABNORMAL HIGH (ref 0–200)
HDL: 68.3 mg/dL (ref >39.00)
LDL Cholesterol: 141 mg/dL — ABNORMAL HIGH (ref 0–99)
NonHDL: 160.39
Total CHOL/HDL Ratio: 3
Triglycerides: 96 mg/dL (ref 0.0–149.0)
VLDL: 19.2 mg/dL (ref 0.0–40.0)

## 2023-07-19 ENCOUNTER — Ambulatory Visit: Payer: Medicare Other | Admitting: Urology

## 2023-07-26 ENCOUNTER — Ambulatory Visit: Payer: Medicare Other | Admitting: Urology

## 2023-07-26 ENCOUNTER — Encounter: Payer: Self-pay | Admitting: Urology

## 2023-07-26 VITALS — BP 144/68 | HR 78

## 2023-07-26 DIAGNOSIS — N390 Urinary tract infection, site not specified: Secondary | ICD-10-CM | POA: Diagnosis not present

## 2023-07-26 DIAGNOSIS — N3281 Overactive bladder: Secondary | ICD-10-CM | POA: Diagnosis not present

## 2023-07-26 DIAGNOSIS — Z87440 Personal history of urinary (tract) infections: Secondary | ICD-10-CM | POA: Diagnosis not present

## 2023-07-26 LAB — URINALYSIS
Bilirubin, UA: NEGATIVE
Glucose, UA: NEGATIVE
Ketones, UA: NEGATIVE
Nitrite, UA: NEGATIVE
Protein,UA: NEGATIVE
Specific Gravity, UA: 1.02 (ref 1.005–1.030)
Urobilinogen, Ur: 0.2 mg/dL (ref 0.2–1.0)
pH, UA: 5.5 (ref 5.0–7.5)

## 2023-07-26 MED ORDER — SOLIFENACIN SUCCINATE 10 MG PO TABS
10.0000 mg | ORAL_TABLET | Freq: Every day | ORAL | 3 refills | Status: DC
Start: 2023-07-26 — End: 2023-11-17

## 2023-07-26 NOTE — Progress Notes (Signed)
   Assessment: 1. Recurrent UTI   2. OAB (overactive bladder)     Plan: Restart vesicare 10mg  (start with 1/2 tab) Reschedule nephrology  Continue UTI prevention including vag estrogen and probiotic FU 4 mo for recheck  Chief Complaint: Bladder issues  HPI: Brandi Moon is a 78 y.o. female who presents for continued evaluation of recurrent UTI as well as OAB.   Visit conducted today with Spanish medical interpreter. Please see my note 11/24/2022 at the time of initial visit for detailed history. Patient has been using UTI prevention measures including a probiotic and vaginal estrogen cream.  Patient has had no break through UTI's. Patient was also prescribed Vesicare 5 mg daily for OAB. This helped some but she only took for 55mo  Patient also has had persistent microscopic hematuria with a hematuria profile negative for malignant cells or dysplasia.  Note was finding dysmorphic RBCs as well as RBC casts indicative of renal parenchymal bleeding.  Note is made that she does have normal renal function and no evidence of proteinuria.  Nephrologic referral was made however the patient did not follow-up with that.    Portions of the above documentation were copied from a prior visit for review purposes only.  Allergies: No Known Allergies  PMH: Past Medical History:  Diagnosis Date   Arthritis    Cancer (HCC)    uterine   Leg cramping     PSH: Past Surgical History:  Procedure Laterality Date   ABDOMINAL HYSTERECTOMY     histerectomy      SH: Social History   Tobacco Use   Smoking status: Never   Smokeless tobacco: Never  Substance Use Topics   Alcohol use: No   Drug use: No    ROS: Constitutional:  Negative for fever, chills, weight loss CV: Negative for chest pain, previous MI, hypertension Respiratory:  Negative for shortness of breath, wheezing, sleep apnea, frequent cough GI:  Negative for nausea, vomiting, bloody stool, GERD  PE: BP (!) 144/68    Pulse 78  GENERAL APPEARANCE:  Well appearing, well developed, well nourished, NAD    Results: Dipstick UA shows 3+ blood and 1+ leukocytes

## 2023-07-28 ENCOUNTER — Ambulatory Visit (INDEPENDENT_AMBULATORY_CARE_PROVIDER_SITE_OTHER): Payer: Medicare Other | Admitting: Medical

## 2023-07-28 VITALS — BP 134/72 | HR 70 | Temp 98.0°F | Resp 18 | Ht 64.0 in | Wt 172.4 lb

## 2023-07-28 DIAGNOSIS — R1310 Dysphagia, unspecified: Secondary | ICD-10-CM

## 2023-07-28 DIAGNOSIS — R319 Hematuria, unspecified: Secondary | ICD-10-CM

## 2023-07-28 DIAGNOSIS — N3281 Overactive bladder: Secondary | ICD-10-CM | POA: Diagnosis not present

## 2023-07-28 DIAGNOSIS — Z87440 Personal history of urinary (tract) infections: Secondary | ICD-10-CM

## 2023-07-28 MED ORDER — ATORVASTATIN CALCIUM 20 MG PO TABS
20.0000 mg | ORAL_TABLET | Freq: Every day | ORAL | 3 refills | Status: DC
Start: 2023-07-28 — End: 2024-02-23

## 2023-07-28 NOTE — Progress Notes (Signed)
   Subjective:    Patient ID: Brandi Moon, female    DOB: 04-07-1946, 78 y.o.   MRN: [id number]  HPI   Discussed the use of AI scribe software for clinical note transcription with the patient, who gave verbal consent to proceed.  History of Present Illness   JILLIANE KAZANJIAN is a 78 year old female who presents for follow-up regarding urinary symptoms and blood pressure management.  She has been experiencing symptoms of overactive bladder and is currently taking VESIcare to manage these symptoms. A urologist recommended measures to prevent urinary infections and suggested a follow-up with a nephrologist due to the presence of a small amount of blood in her urine. She has been trying to schedule an appointment with the nephrologist but has faced difficulties in communication.  Her blood pressure readings have been stable, with a recent reading of 134/72 mmHg. Home readings were 129/69 mmHg. She was previously on Benicar but has switched to amlodipine 5 mg daily, which is working well.  She has a history of elevated cholesterol levels and is currently taking atorvastatin 10 mg daily. Despite adherence to the medication, her cholesterol levels remain high. She plans to use her remaining 10 mg tablets by taking two at a time.(advised pt). New rx 20 mg dose sent as well.        Review of Systems See hpi    Objective:   Physical Exam  General Mental Status- Alert. General Appearance- Not in acute distress.   Skin General: Color- Normal Color. Moisture- Normal Moisture.  Neck Carotid Arteries- Normal color. Moisture- Normal Moisture. No carotid bruits. No JVD.  Chest and Lung Exam Auscultation: Breath Sounds:-Normal.  Cardiovascular Auscultation:Rythm- Regular. Murmurs & Other Heart Sounds:Auscultation of the heart reveals- No Murmurs.    Neurologic Cranial Nerve exam:- CN III-XII intact(No nystagmus), symmetric smile. Strength:- 5/5 equal and symmetric strength both  upper and lower extremities.       Assessment & Plan:   Assessment and Plan    Overactive Bladder On VESIcare for symptom management. No new urinary complaints. -Continue VESIcare as prescribed by urologist.  Hematuria Urologist suspects renal origin. No recent nephrology follow-up due to communication issues. -Attempt to schedule appointment with nephrologist. If unable to schedule within a week, notify the office.  Hypertension Controlled on Amlodipine 5mg  daily. -Continue Amlodipine 5mg  daily.  Hyperlipidemia Despite Atorvastatin 10mg  daily, cholesterol levels remain elevated. -Increase Atorvastatin to 20mg  daily. If current supply of 10mg  tablets remains, take two tablets until depleted.   For dysphagia last visit sent referral to [facility]. They called and left message. Gave number to call them directly.  Follow-up in 3 months for cholesterol check. Return sooner if other issues arise.       Esperanza Richters, PA-C

## 2023-07-28 NOTE — Patient Instructions (Signed)
 Overactive Bladder On VESIcare for symptom management. No new urinary complaints. -Continue VESIcare as prescribed by urologist.  Hematuria Urologist suspects renal origin. No recent nephrology follow-up due to communication issues. -Attempt to schedule appointment with nephrologist. If unable to schedule within a week, notify the office.  Hypertension Controlled on Amlodipine 5mg  daily. -Continue Amlodipine 5mg  daily.  Hyperlipidemia Despite Atorvastatin 10mg  daily, cholesterol levels remain elevated. -Increase Atorvastatin to 20mg  daily. If current supply of 10mg  tablets remains, take two tablets until depleted.   For dysphagia last visit sent referral to [facility]. They called and left message. Gave number to call them directly.  Follow-up in 3 months for cholesterol check. Return sooner if other issues arise.

## 2023-08-03 ENCOUNTER — Other Ambulatory Visit: Payer: Self-pay | Admitting: Medical

## 2023-10-27 ENCOUNTER — Ambulatory Visit (INDEPENDENT_AMBULATORY_CARE_PROVIDER_SITE_OTHER): Payer: Medicare Other | Admitting: Medical

## 2023-10-27 ENCOUNTER — Encounter: Payer: Self-pay | Admitting: Medical

## 2023-10-27 VITALS — BP 120/78 | HR 85 | Temp 98.1°F | Resp 18 | Ht 64.0 in | Wt 162.0 lb

## 2023-10-27 DIAGNOSIS — R319 Hematuria, unspecified: Secondary | ICD-10-CM | POA: Diagnosis not present

## 2023-10-27 DIAGNOSIS — N3281 Overactive bladder: Secondary | ICD-10-CM | POA: Diagnosis not present

## 2023-10-27 DIAGNOSIS — R1310 Dysphagia, unspecified: Secondary | ICD-10-CM | POA: Diagnosis not present

## 2023-10-27 DIAGNOSIS — I10 Essential (primary) hypertension: Secondary | ICD-10-CM | POA: Diagnosis not present

## 2023-10-27 DIAGNOSIS — E785 Hyperlipidemia, unspecified: Secondary | ICD-10-CM | POA: Diagnosis not present

## 2023-10-27 NOTE — Progress Notes (Signed)
 Subjective:    Patient ID: Brandi Moon, female    DOB: 05-Aug-1945, 78 y.o.   MRN: [id number]  HPI   Brandi Moon is a 78 year old female who presents with difficulty swallowing and hematuria.  She experiences difficulty swallowing, often choking while eating, necessitating small bites to prevent choking. Her daughter has observed throat issues even during speech. This problem is frequent during swallowing. Referred to gi MD in sept. As of today date no appt though gi office has reached out for appt.  She has noticed blood in her urine and has scheduled appointments with a nephrologist on June 18th and a urologist on June 20th. The medication prescribed by the urologist has not alleviated her symptoms.  Her blood pressure was previously recorded at 120/78 mmHg, and she is currently on amlodipine  5 mg for hypertension management.  Her cholesterol levels were last checked and found to be elevated, with a total cholesterol of 221 mg/dL and LDL cholesterol of 141 mg/dL. She is on atorvastatin , which was recently increased from 10 mg to 20 mg.      Review of Systems  Constitutional:  Negative for chills, fatigue and fever.  Respiratory:  Negative for cough, choking and wheezing.   Cardiovascular:  Negative for chest pain and palpitations.  Gastrointestinal:  Negative for abdominal pain.       No current symptoms but see hpi.  Genitourinary:  Positive for frequency and hematuria.       See hpi.  Musculoskeletal:  Negative for back pain.  Neurological:  Negative for dizziness, speech difficulty, weakness and numbness.  Hematological:  Negative for adenopathy.  Psychiatric/Behavioral:  Negative for behavioral problems and confusion.    Past Medical History:  Diagnosis Date   Arthritis    Cancer (HCC)    uterine   Leg cramping      Social History   Socioeconomic History   Marital status: Widowed    Spouse name: Not on file   Number of children: Not on file   Years  of education: Not on file   Highest education level: Not on file  Occupational History   Not on file  Tobacco Use   Smoking status: Never   Smokeless tobacco: Never  Substance and Sexual Activity   Alcohol use: No   Drug use: No   Sexual activity: Not on file  Other Topics Concern   Not on file  Social History Narrative   Not on file   Social Drivers of Health   Financial Resource Strain: Not on file  Food Insecurity: Not on file  Transportation Needs: Not on file  Physical Activity: Not on file  Stress: Not on file  Social Connections: Not on file  Intimate Partner Violence: Not on file    Past Surgical History:  Procedure Laterality Date   ABDOMINAL HYSTERECTOMY     histerectomy      Family History  Problem Relation Age of Onset   COPD Mother    Heart attack Father     No Known Allergies  Current Outpatient Medications on File Prior to Visit  Medication Sig Dispense Refill   amLODipine  (NORVASC ) 5 MG tablet Take 1 tablet (5 mg total) by mouth daily. 90 tablet 1   atorvastatin  (LIPITOR) 20 MG tablet Take 1 tablet (20 mg total) by mouth daily. 90 tablet 3   solifenacin  (VESICARE ) 10 MG tablet Take 1 tablet (10 mg total) by mouth daily. 90 tablet 3   No current  facility-administered medications on file prior to visit.    BP 120/78   Pulse 85   Temp 98.1 F (36.7 C)   Resp 18   Ht 5\' 4"  (1.626 m)   Wt 162 lb (73.5 kg)   SpO2 95%   BMI 27.81 kg/m        Objective:   Physical Exam  General Mental Status- Alert. General Appearance- Not in acute distress.   Skin General: Color- Normal Color. Moisture- Normal Moisture.  Neck Carotid Arteries- Normal color. Moisture- Normal Moisture. No carotid bruits. No JVD.  Chest and Lung Exam Auscultation: Breath Sounds:-CTA  Cardiovascular Auscultation:Rythm- RRR Murmurs & Other Heart Sounds:Auscultation of the heart reveals- No Murmurs.  Abdomen Inspection:-Inspeection  Normal. Palpation/Percussion:Note:No mass. Palpation and Percussion of the abdomen reveal- Non Tender, Non Distended + BS, no rebound or guarding.   Neurologic Cranial Nerve exam:- CN III-XII intact(No nystagmus), symmetric smile. Strength:- 5/5 equal and symmetric strength both upper and lower extremities.       Assessment & Plan:   Patient Instructions  Dysphagia Intermittent dysphagia with choking episodes. Gastroenterology referral needed. - Provide referral to gastroenterology again. - Instruct her to schedule an appointment with gastroenterology.  Hypertension Well-controlled hypertension on amlodipine  5 mg. - Continue amlodipine  5 mg.  Hyperlipidemia LDL 141 mg/dL. On atorvastatin  20 mg. Awaiting further cholesterol evaluation. - Order metabolic panel and cholesterol check in the next week. - Instruct her to fast after midnight before the test. - Communicate results to her. - Evaluate if atorvastatin  20 mg is sufficient based on test results.  For hematuria and overactive bladder follow up with nephrology and urology as planned.  Follow up september or sooner if needed   [employer], PA-C

## 2023-10-27 NOTE — Patient Instructions (Signed)
 Dysphagia Intermittent dysphagia with choking episodes. Gastroenterology referral needed. - Provide referral to gastroenterology again. - Instruct her to schedule an appointment with gastroenterology.  Hypertension Well-controlled hypertension on amlodipine  5 mg. - Continue amlodipine  5 mg.  Hyperlipidemia LDL 141 mg/dL. On atorvastatin  20 mg. Awaiting further cholesterol evaluation. - Order metabolic panel and cholesterol check in the next week. - Instruct her to fast after midnight before the test. - Communicate results to her. - Evaluate if atorvastatin  20 mg is sufficient based on test results.  For hematuria and overactive bladder follow up with nephrology and urology as planned.  Follow up september or sooner if needed

## 2023-10-30 ENCOUNTER — Encounter: Payer: Self-pay | Admitting: Gastroenterology

## 2023-10-30 ENCOUNTER — Ambulatory Visit: Payer: Self-pay | Admitting: Medical

## 2023-10-30 ENCOUNTER — Other Ambulatory Visit (INDEPENDENT_AMBULATORY_CARE_PROVIDER_SITE_OTHER)

## 2023-10-30 DIAGNOSIS — I10 Essential (primary) hypertension: Secondary | ICD-10-CM

## 2023-10-30 DIAGNOSIS — E785 Hyperlipidemia, unspecified: Secondary | ICD-10-CM

## 2023-10-30 LAB — LIPID PANEL
Cholesterol: 150 mg/dL (ref 0–200)
HDL: 63.8 mg/dL (ref >39.00)
LDL Cholesterol: 71 mg/dL (ref 0–99)
NonHDL: 85.89
Total CHOL/HDL Ratio: 2
Triglycerides: 75 mg/dL (ref 0.0–149.0)
VLDL: 15 mg/dL (ref 0.0–40.0)

## 2023-10-30 LAB — COMPREHENSIVE METABOLIC PANEL WITH GFR
ALT: 10 U/L (ref 0–35)
AST: 14 U/L (ref 0–37)
Albumin: 3.7 g/dL (ref 3.5–5.2)
Alkaline Phosphatase: 79 U/L (ref 39–117)
BUN: 16 mg/dL (ref 6–23)
CO2: 30 meq/L (ref 19–32)
Calcium: 9.2 mg/dL (ref 8.4–10.5)
Chloride: 104 meq/L (ref 96–112)
Creatinine, Ser: 0.83 mg/dL (ref 0.40–1.20)
GFR: 67.76 mL/min (ref >60.00)
Glucose, Bld: 99 mg/dL (ref 70–99)
Potassium: 4.4 meq/L (ref 3.5–5.1)
Sodium: 140 meq/L (ref 135–145)
Total Bilirubin: 0.5 mg/dL (ref 0.2–1.2)
Total Protein: 6.3 g/dL (ref 6.0–8.3)

## 2023-11-15 DIAGNOSIS — R3129 Other microscopic hematuria: Secondary | ICD-10-CM | POA: Diagnosis not present

## 2023-11-15 DIAGNOSIS — Z87440 Personal history of urinary (tract) infections: Secondary | ICD-10-CM | POA: Diagnosis not present

## 2023-11-15 DIAGNOSIS — R634 Abnormal weight loss: Secondary | ICD-10-CM | POA: Diagnosis not present

## 2023-11-17 ENCOUNTER — Encounter: Payer: Self-pay | Admitting: Urology

## 2023-11-17 ENCOUNTER — Ambulatory Visit: Payer: Medicare Other | Admitting: Urology

## 2023-11-17 VITALS — BP 133/82 | HR 80 | Ht 64.0 in | Wt 160.0 lb

## 2023-11-17 DIAGNOSIS — R3129 Other microscopic hematuria: Secondary | ICD-10-CM | POA: Diagnosis not present

## 2023-11-17 DIAGNOSIS — N3281 Overactive bladder: Secondary | ICD-10-CM

## 2023-11-17 DIAGNOSIS — N390 Urinary tract infection, site not specified: Secondary | ICD-10-CM

## 2023-11-17 LAB — MICROSCOPIC EXAMINATION

## 2023-11-17 LAB — URINALYSIS, ROUTINE W REFLEX MICROSCOPIC
Bilirubin, UA: NEGATIVE
Glucose, UA: NEGATIVE
Nitrite, UA: NEGATIVE
Specific Gravity, UA: 1.02 (ref 1.005–1.030)
Urobilinogen, Ur: 1 mg/dL (ref 0.2–1.0)
pH, UA: 6.5 (ref 5.0–7.5)

## 2023-11-17 MED ORDER — MIRABEGRON ER 50 MG PO TB24: 50.0000 mg | ORAL_TABLET | Freq: Every day | ORAL | 11 refills | Status: DC

## 2023-11-17 NOTE — Progress Notes (Signed)
 Assessment: 1. Recurrent UTI   2. OAB (overactive bladder)   3. Microscopic hematuria - negative urologic evaluation; likley nephrologic etiology     Plan: I personally reviewed the patient's chart including provider notes, lab and imaging results. Discontinue solifenacin  due to constipation. Begin Myrbetriq 50 mg daily for OAB/urge incontinence.  I do not think she would be able to tolerate any other medications due to the possible side effect of constipation given her response to the solifenacin . Continue UTI prevention with vaginal estrogen cream and probiotic. Return to office in 3 months.  Chief Complaint: Chief Complaint  Patient presents with   Recurrent UTI    HPI: Brandi Moon is a 78 y.o. female who presents for continued evaluation of recurrent UTI and OAB. She was previously followed by Dr. Del Favia and was last seen in February 2025.  She has a history of recurrent UTIs and was initially seen in June 2024.  She was started on daily antibiotic prophylaxis with single strength Bactrim  and vaginal hormone cream at that time. She continued on vaginal hormone cream and a probiotic.  She has not had any breakthrough UTIs.  She underwent evaluation for microscopic hematuria in July 2024. CT showed no renal or ureteral calculi, no renal masses and no evidence of obstruction or filling defect.  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 Cystoscopy was not performed.  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 Hematuria profile from 10/24 was negative for malignant cells or dysplasia.  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 Cystoscopy from 10/24 showed a normal urethra and bladder without any mucosal lesions. She was referred to nephrology but had not followed up with the  referral at the time of her visit in February 2025. She was seen by nephrology on 11/15/2023.  She has a history of overactive bladder symptoms with associated incontinence.  She was restarted on Vesicare  10 mg daily in February 2025.  She returns today for follow-up.  She continues to have symptoms of frequency, urgency, and incontinence.  She is also having problems with constipation since she started the Vesicare .  No dysuria or gross hematuria.  No recent UTIs.  Interpreter was used during visit.  Portions of the above documentation were copied from a prior visit for review purposes only.  Allergies: No Known Allergies  PMH: Past Medical History:  Diagnosis Date   Arthritis    Cancer (HCC)    uterine   Leg cramping     PSH: Past Surgical History:  Procedure Laterality Date   ABDOMINAL HYSTERECTOMY     histerectomy      SH: Social History   Tobacco Use   Smoking status: Never   Smokeless tobacco: Never  Substance Use Topics   Alcohol use: No   Drug use: No    ROS: Constitutional:  Negative for fever, chills, weight loss CV: Negative for chest pain, previous MI, hypertension Respiratory:  Negative for shortness of breath, wheezing, sleep apnea, frequent cough GI:  Negative for nausea, vomiting, bloody stool, GERD  PE: BP 133/82   Pulse 80   Ht 5' 4 (1.626 m)   Wt 160 lb (72.6 kg)   BMI 27.46 kg/m  GENERAL APPEARANCE:  Well appearing, well developed, well nourished, NAD HEENT:  Atraumatic, normocephalic, oropharynx clear NECK:  Supple without lymphadenopathy or thyromegaly ABDOMEN:  Soft, non-tender,  no masses EXTREMITIES:  Moves all extremities well, without clubbing, cyanosis, or edema NEUROLOGIC:  Alert and oriented x 3, normal gait, CN II-XII grossly intact MENTAL STATUS:  appropriate BACK:  Non-tender to palpation, No CVAT SKIN:  Warm, dry, and intact   Results: U/A: 6-10 WBCs, 3-10 RBCs

## 2023-12-26 ENCOUNTER — Ambulatory Visit: Admitting: Gastroenterology

## 2023-12-27 ENCOUNTER — Encounter: Payer: Self-pay | Admitting: Internal Medicine

## 2023-12-27 ENCOUNTER — Ambulatory Visit: Admitting: Gastroenterology

## 2023-12-27 ENCOUNTER — Encounter: Payer: Self-pay | Admitting: Gastroenterology

## 2023-12-27 VITALS — BP 112/58 | HR 83 | Ht 64.0 in | Wt 167.5 lb

## 2023-12-27 DIAGNOSIS — R1310 Dysphagia, unspecified: Secondary | ICD-10-CM | POA: Diagnosis not present

## 2023-12-27 NOTE — Patient Instructions (Signed)
 You have been scheduled for an endoscopy. Please follow written instructions given to you at your visit today.  If you use inhalers (even only as needed), please bring them with you on the day of your procedure.  If you take any of the following medications, they will need to be adjusted prior to your procedure:   DO NOT TAKE 7 DAYS PRIOR TO TEST- Trulicity (dulaglutide) Ozempic, Wegovy (semaglutide) Mounjaro (tirzepatide) Bydureon Bcise (exanatide extended release)  DO NOT TAKE 1 DAY PRIOR TO YOUR TEST Rybelsus (semaglutide) Adlyxin (lixisenatide) Victoza (liraglutide) Byetta (exanatide) ___________________________________________________________________________   _______________________________________________________  If your blood pressure at your visit was 140/90 or greater, please contact your primary care physician to follow up on this.  _______________________________________________________  If you are age 78 or older, your body mass index should be between 23-30. Your Body mass index is 28.75 kg/m. If this is out of the aforementioned range listed, please consider follow up with your Primary Care Provider.  If you are age 78 or younger, your body mass index should be between 19-25. Your Body mass index is 28.75 kg/m. If this is out of the aformentioned range listed, please consider follow up with your Primary Care Provider.   ________________________________________________________  The [facility] providers would like to encourage you to use MYCHART to communicate with providers for non-urgent requests or questions.  Due to long hold times on the telephone, sending your provider a message by [facility] may be a faster and more efficient way to get a response.  Please allow 48 business hours for a response.  Please remember that this is for non-urgent requests.  _______________________________________________________  [facility] is using a team-based approach  to care.  Your team is made up of your doctor and two to three APPS. Our APPS (Nurse Practitioners and Physician Assistants) work with your physician to ensure care continuity for you. They are fully qualified to address your health concerns and develop a treatment plan. They communicate directly with your gastroenterologist to care for you. Seeing the Advanced Practice Practitioners on your physician's team can help you by facilitating care more promptly, often allowing for earlier appointments, access to diagnostic testing, procedures, and other specialty referrals.   Thank you for choosing me and [facility].  Jessica Zehr, PA-C

## 2023-12-27 NOTE — Progress Notes (Signed)
     12/27/2023 Brandi Moon [id number] Mar 04, 1946   HISTORY OF PRESENT ILLNESS: This is a 78 year old female who is new to our office.  She is primarily Spanish-speaking, but her daughter in law was present to provide translation.  She is here today for complaints of dysphagia.  For about the past year or so she has been having intermittent dysphagia particularly to meat products.  Intermittently food will get stuck and then she will try to drink water to push it down.  Eventually the food goes down, but usually the liquid comes back up as she is trying to get it to move.  She denies heartburn or reflux type symptoms.   Past Medical History:  Diagnosis Date   Arthritis    Cancer (HCC)    uterine   Hyperlipidemia    Hypertension    Leg cramping    Past Surgical History:  Procedure Laterality Date   ABDOMINAL HYSTERECTOMY     histerectomy      reports that she has never smoked. She has never used smokeless tobacco. She reports that she does not drink alcohol and does not use drugs. family history includes COPD in her mother; Heart attack in her father. No Known Allergies    Outpatient Encounter Medications as of 12/27/2023  Medication Sig   amLODipine  (NORVASC ) 5 MG tablet Take 1 tablet (5 mg total) by mouth daily.   atorvastatin  (LIPITOR) 20 MG tablet Take 1 tablet (20 mg total) by mouth daily.   [DISCONTINUED] mirabegron  ER (MYRBETRIQ ) 50 MG TB24 tablet Take 1 tablet (50 mg total) by mouth daily.   No facility-administered encounter medications on file as of 12/27/2023.    REVIEW OF SYSTEMS  : All other systems reviewed and negative except where noted in the History of Present Illness.   PHYSICAL EXAM: BP (!) 112/58 (BP Location: Left Arm, Patient Position: Sitting, Cuff Size: Normal)   Pulse 83   Ht 5' 4 (1.626 m)   Wt 167 lb 8 oz (76 kg)   BMI 28.75 kg/m  General: Well developed Hispanic female in no acute distress Head: Normocephalic and atraumatic Eyes:   Sclerae anicteric, conjunctiva pink. Ears: Normal auditory acuity Lungs: Clear throughout to auscultation; no W/R/R. Heart: Regular rate and rhythm; no M/R/G. Musculoskeletal: Symmetrical with no gross deformities  Skin: No lesions on visible extremities Neurological: Alert oriented x 4, grossly non-focal Psychological:  Alert and cooperative. Normal mood and affect  ASSESSMENT AND PLAN: *Dysphagia: Intermittent dysphagia to solid foods, particularly meats.  Occurring for about a year.  No dysphagia to liquids alone.  Never had EGD in the past.  Denies heartburn/reflux.  Will plan for EGD with possible dilation with Dr. Avram.  The risks, benefits, and alternatives to EGD with dilation were discussed with the patient and she consents to proceed.   **Of note, does not appear that patient has ever had colon cancer screening.  She is 78 years old at this point, but in fairly good health.  Could consider offering colon cancer screening at some point.  Decided not to discuss this today as doing the procedures together would likely have delayed their endoscopic evaluation.  Can consider this at a later date.     CC:  [place], [place], PA-C

## 2024-01-02 ENCOUNTER — Encounter: Payer: Self-pay | Admitting: Internal Medicine

## 2024-01-02 ENCOUNTER — Ambulatory Visit (AMBULATORY_SURGERY_CENTER): Admitting: Internal Medicine

## 2024-01-02 VITALS — BP 156/65 | HR 66 | Temp 97.9°F | Resp 12 | Ht 64.0 in | Wt 167.0 lb

## 2024-01-02 DIAGNOSIS — B9681 Helicobacter pylori [H. pylori] as the cause of diseases classified elsewhere: Secondary | ICD-10-CM | POA: Diagnosis not present

## 2024-01-02 DIAGNOSIS — R1310 Dysphagia, unspecified: Secondary | ICD-10-CM

## 2024-01-02 DIAGNOSIS — K222 Esophageal obstruction: Secondary | ICD-10-CM

## 2024-01-02 DIAGNOSIS — K2950 Unspecified chronic gastritis without bleeding: Secondary | ICD-10-CM

## 2024-01-02 DIAGNOSIS — R1319 Other dysphagia: Secondary | ICD-10-CM

## 2024-01-02 DIAGNOSIS — K2970 Gastritis, unspecified, without bleeding: Secondary | ICD-10-CM

## 2024-01-02 MED ORDER — OMEPRAZOLE 40 MG PO CPDR: 40.0000 mg | DELAYED_RELEASE_CAPSULE | Freq: Every day | ORAL | 3 refills | Status: AC

## 2024-01-02 MED ORDER — SODIUM CHLORIDE 0.9 % IV SOLN: 500.0000 mL | Freq: Once | INTRAVENOUS | Status: DC

## 2024-01-02 NOTE — Progress Notes (Signed)
 History and Physical Interval Note:  01/02/2024 2:23 PM  Brandi Moon  has presented today for endoscopic procedure(s), with the diagnosis of  Encounter Diagnosis  Name Primary?   Esophageal dysphagia Yes  .  The various methods of evaluation and treatment have been discussed with the patient and/or family. After consideration of risks, benefits and other options for treatment, the patient has consented to  the endoscopic procedure(s).   The patient's history has been reviewed, patient examined, no change in status, stable for endoscopic procedure(s).  I have reviewed the patient's chart and labs.  Questions were answered to the patient's satisfaction.     [name], MD, [facility]

## 2024-01-02 NOTE — Patient Instructions (Addendum)
 There was a stricture, narrow area in the esophagus that I dilated.  You should be able to swallow better.  The stomach looks inflamed, we call that gastritis.  I took biopsies to check for the cause and possible infection.  Will follow-up with the results and plans.  I am going ahead and starting some acid blocking medicine to treat you for the gastritis and suspected esophageal reflux which may have caused the stricture.  I appreciate the opportunity to care for you. [name], MD, [facility]   Clear liquids x1 hour then soft foods rest of day. Start prior diet tomorrow. (Provided post esophageal dilation diet) Continue present medications. Awaiting pathology results. Start omeprazole  40 mg daily before breakfast. Handouts provided on gastritis and stricture.  YOU HAD AN ENDOSCOPIC PROCEDURE TODAY AT THE [facility]:   Refer to the procedure report that was given to you for any specific questions about what was found during the examination.  If the procedure report does not answer your questions, please call your gastroenterologist to clarify.  If you requested that your care partner not be given the details of your procedure findings, then the procedure report has been included in a sealed envelope for you to review at your convenience later.  YOU SHOULD EXPECT: Some feelings of bloating in the abdomen. Passage of more gas than usual.  Walking can help get rid of the air that was put into your GI tract during the procedure and reduce the bloating. If you had a lower endoscopy (such as a colonoscopy or flexible sigmoidoscopy) you may notice spotting of blood in your stool or on the toilet paper. If you underwent a bowel prep for your procedure, you may not have a normal bowel movement for a few days.  Please Note:  You might notice some irritation and congestion in your nose or some drainage.  This is from the oxygen used during your procedure.  There is no need for concern and  it should clear up in a day or so.  SYMPTOMS TO REPORT IMMEDIATELY:  Following upper endoscopy (EGD)  Vomiting of blood or coffee ground material  New chest pain or pain under the shoulder blades  Painful or persistently difficult swallowing  New shortness of breath  Fever of 100F or higher  Black, tarry-looking stools  For urgent or emergent issues, a gastroenterologist can be reached at any hour by calling [phone]. Do not use MyChart messaging for urgent concerns.    DIET:  We do recommend a small meal at first, but then you may proceed to your regular diet.  Drink plenty of fluids but you should avoid alcoholic beverages for 24 hours.  ACTIVITY:  You should plan to take it easy for the rest of today and you should NOT DRIVE or use heavy machinery until tomorrow (because of the sedation medicines used during the test).    FOLLOW UP: Our staff will call the number listed on your records the next business day following your procedure.  We will call around 7:15- 8:00 am to check on you and address any questions or concerns that you may have regarding the information given to you following your procedure. If we do not reach you, we will leave a message.     If any biopsies were taken you will be contacted by phone or by letter within the next 1-3 weeks.  Please call us  at [phone] if you have not heard about the biopsies in 3  weeks.    SIGNATURES/CONFIDENTIALITY: You and/or your care partner have signed paperwork which will be entered into your electronic medical record.  These signatures attest to the fact that that the information above on your After Visit Summary has been reviewed and is understood.  Full responsibility of the confidentiality of this discharge information lies with you and/or your care-partner.

## 2024-01-02 NOTE — Progress Notes (Signed)
 Vss nad trans to pacu

## 2024-01-02 NOTE — Op Note (Signed)
 [facility] Patient Name: Brandi Moon Procedure Date: 01/02/2024 2:23 PM MRN: [id number] Endoscopist: Lupita FORBES Commander , MD, [id number] Age: 78 Referring MD:  Date of Birth: 09/02/1945 Gender: Female Account #: [id number] Procedure:                Upper GI endoscopy Indications:              Dysphagia Medicines:                Monitored Anesthesia Care Procedure:                Pre-Anesthesia Assessment:                           - Prior to the procedure, a History and Physical                            was performed, and patient medications and                            allergies were reviewed. The patient's tolerance of                            previous anesthesia was also reviewed. The risks                            and benefits of the procedure and the sedation                            options and risks were discussed with the patient.                            All questions were answered, and informed consent                            was obtained. Prior Anticoagulants: The patient has                            taken no anticoagulant or antiplatelet agents. ASA                            Grade Assessment: II - A patient with mild systemic                            disease. After reviewing the risks and benefits,                            the patient was deemed in satisfactory condition to                            undergo the procedure.                           After obtaining informed consent, the endoscope was  passed under direct vision. Throughout the                            procedure, the patient's blood pressure, pulse, and                            oxygen saturations were monitored continuously. The                            GIF [id number] #[id number] was introduced through the                            mouth, and advanced to the second part of duodenum.                            The upper GI endoscopy was accomplished  without                            difficulty. The patient tolerated the procedure                            well. Scope In: Scope Out: Findings:                 One benign-appearing, intrinsic moderate                            (circumferential scarring or stenosis; an endoscope                            may pass) stenosis was found in the distal                            esophagus. The stenosis was traversed. A TTS                            dilator was passed through the scope. Dilation with                            a 15-16.5-18 mm balloon dilator was performed to 18                            mm. The dilation site was examined and showed                            moderate mucosal disruption and moderate                            improvement in luminal narrowing. Estimated blood                            loss was minimal.                           The gastroesophageal flap valve was visualized  endoscopically and classified as Hill Grade III                            (minimal fold, loose to endoscope, hiatal hernia                            likely).                           Diffuse moderate inflammation characterized by                            congestion (edema), erythema, friability and                            granularity was found in the entire examined                            stomach. Biopsies were taken with a cold forceps                            for histology. Verification of patient                            identification for the specimen was done. Estimated                            blood loss was minimal.                           The examined duodenum was normal.                           The cardia and gastric fundus were otherwise normal                            on retroflexion. Complications:            No immediate complications. Estimated Blood Loss:     Estimated blood loss was minimal. Impression:               -  Benign-appearing esophageal stenosis. Dilated.                           - Gastroesophageal flap valve classified as Hill                            Grade III (minimal fold, loose to endoscope, hiatal                            hernia likely).                           - Gastritis. Biopsied. Sydney protocol - antrum jar                            1, body  jar 2 and fundus + cardia jar 3                           - Normal examined duodenum. Recommendation:           - Patient has a contact number available for                            emergencies. The signs and symptoms of potential                            delayed complications were discussed with the                            patient. Return to normal activities tomorrow.                            Written discharge instructions were provided to the                            patient.                           - Clear liquids x 1 hour then soft foods rest of                            day. Start prior diet tomorrow.                           - Continue present medications.                           - Await pathology results.                           - Start omeprazole  40 mg daily before breakfast. Lupita FORBES Commander, MD 01/02/2024 2:55:46 PM This report has been signed electronically.

## 2024-01-02 NOTE — Progress Notes (Signed)
 Called to room to assist during endoscopic procedure.  Patient ID and intended procedure confirmed with present staff. Received instructions for my participation in the procedure from the performing physician.

## 2024-01-03 ENCOUNTER — Telehealth: Payer: Self-pay | Admitting: *Deleted

## 2024-01-03 NOTE — Telephone Encounter (Signed)
  Follow up Call-     01/02/2024    1:26 PM  Call back number  Post procedure Call Back phone  # [phone] (her son) patient does not speak [place].  Son's name is Brandi Moon  Permission to leave phone message Yes     Patient questions:  Do you have a fever, pain , or abdominal swelling? No. Pain Score  0 *  Have you tolerated food without any problems? Yes.    Have you been able to return to your normal activities? Yes.    Do you have any questions about your discharge instructions: Diet   No. Medications  No. Follow up visit  No.  Do you have questions or concerns about your Care? No.  Actions: * If pain score is 4 or above: No action needed, pain <4.  Spoke with son Brandi Moon who stated that his mother is doing good. No issues or concerns post procedure.

## 2024-01-04 LAB — SURGICAL PATHOLOGY

## 2024-01-07 ENCOUNTER — Telehealth: Payer: Self-pay | Admitting: Internal Medicine

## 2024-01-07 ENCOUNTER — Ambulatory Visit: Payer: Self-pay | Admitting: Internal Medicine

## 2024-01-07 ENCOUNTER — Encounter: Payer: Self-pay | Admitting: Internal Medicine

## 2024-01-07 DIAGNOSIS — K219 Gastro-esophageal reflux disease without esophagitis: Secondary | ICD-10-CM | POA: Insufficient documentation

## 2024-01-07 DIAGNOSIS — B9681 Helicobacter pylori [H. pylori] as the cause of diseases classified elsewhere: Secondary | ICD-10-CM | POA: Insufficient documentation

## 2024-01-07 NOTE — Telephone Encounter (Signed)
 Patient has H. pylori.  She is Spanish-speaking but she has daughters her daughter-in-law's who are nurses so contact them and explain she has the H. pylori.  I have pended the prescriptions she needs.  The only other thing she needs is during the treatment course she can take an OTC omeprazole  at 20 mg for a second dose.  Once she is done the treatment plan she can stop that but continue omeprazole  40 mg daily due to her GERD and esophageal stricture.  She needs to do a Diathe[employer] stool test for H. pylori about 2 weeks or later after completing the treatment.  She can follow-up as needed if the dysphagia has not resolved.

## 2024-01-08 NOTE — Telephone Encounter (Signed)
 Called daughter Ms Lajoyce at [phone]. No answer. No voicemail.

## 2024-01-12 MED ORDER — DOXYCYCLINE HYCLATE 100 MG PO TABS: 100.0000 mg | ORAL_TABLET | Freq: Two times a day (BID) | ORAL | 0 refills | Status: AC

## 2024-01-12 MED ORDER — METRONIDAZOLE 250 MG PO TABS: 250.0000 mg | ORAL_TABLET | Freq: Four times a day (QID) | ORAL | 0 refills | Status: AC

## 2024-01-12 MED ORDER — BISMUTH SUBSALICYLATE 262 MG PO CHEW: 524.0000 mg | CHEWABLE_TABLET | Freq: Four times a day (QID) | ORAL | 0 refills | Status: AC

## 2024-01-12 NOTE — Telephone Encounter (Signed)
 Called and spoke with patient's daughter Brandi Moon regarding positive HP results. Discussed medications and dosages of each. Discussed taking 20 mg of Omeprazole  as a hs dose during the 14 days of treatment. Daughter verified all instructions. Prescriptions sent to patient's pharmacy. Reminder placed for patient to come in for repeat testing.

## 2024-01-18 ENCOUNTER — Other Ambulatory Visit: Payer: Self-pay | Admitting: Medical

## 2024-01-30 ENCOUNTER — Ambulatory Visit: Payer: Self-pay | Admitting: Medical

## 2024-01-30 ENCOUNTER — Ambulatory Visit (HOSPITAL_BASED_OUTPATIENT_CLINIC_OR_DEPARTMENT_OTHER)
Admission: RE | Admit: 2024-01-30 | Discharge: 2024-01-30 | Disposition: A | Discharge status: Home / Self Care | Source: Ambulatory Visit | Attending: Medical | Admitting: Medical

## 2024-01-30 ENCOUNTER — Ambulatory Visit: Admitting: Medical

## 2024-01-30 VITALS — BP 118/80 | HR 67 | Temp 97.9°F | Resp 16 | Ht 64.0 in

## 2024-01-30 DIAGNOSIS — M25511 Pain in right shoulder: Secondary | ICD-10-CM | POA: Diagnosis not present

## 2024-01-30 DIAGNOSIS — B9681 Helicobacter pylori [H. pylori] as the cause of diseases classified elsewhere: Secondary | ICD-10-CM

## 2024-01-30 DIAGNOSIS — K2970 Gastritis, unspecified, without bleeding: Secondary | ICD-10-CM | POA: Diagnosis not present

## 2024-01-30 DIAGNOSIS — M79621 Pain in right upper arm: Secondary | ICD-10-CM

## 2024-01-30 DIAGNOSIS — H109 Unspecified conjunctivitis: Secondary | ICD-10-CM

## 2024-01-30 MED ORDER — TOBRAMYCIN 0.3 % OP SOLN: 2.0000 [drp] | Freq: Four times a day (QID) | OPHTHALMIC | 0 refills | Status: DC

## 2024-01-30 MED ORDER — METHYLPREDNISOLONE 4 MG PO TABS: ORAL_TABLET | ORAL | 0 refills | Status: DC

## 2024-01-30 NOTE — Progress Notes (Signed)
 Subjective:    Patient ID: Brandi Moon, female    DOB: 10-Feb-1946, 78 y.o.   MRN: [id number]  HPI  Brandi Moon is a 78 year old female who presents with shoulder and arm pain.  She has been experiencing constant pain in her right shoulder and arm for the past two weeks, which worsens with movement, particularly when lifting her arm, and during sleep. There is no pain upon touch. She denies any recent falls or accidents but suspects the pain may have started after cleaning a large area with a brush.  She recently completed a 14-day course of antibiotics for H. pylori, diagnosed by a gastroenterologist following an endoscopy and biopsy. Prior to treatment, she experienced a sensation of choking when eating, but no stomach pain. The endoscopy revealed gastritis, and she was prescribed omeprazole . No longer has pain swallowing.  She reports irritation in her left eye for the past month, describing it as a sensation of a foreign body with excessive watering, especially at night. The discharge is clear, and there are no changes in vision. She has not had a recent eye exam.    Review of Systems  Constitutional:  Negative for chills and fever.  Eyes:  Positive for discharge. Negative for photophobia, redness, itching and visual disturbance.       See hpi. Watery.  Respiratory:  Negative for chest tightness, shortness of breath and wheezing.   Cardiovascular:  Negative for chest pain and palpitations.  Gastrointestinal:  Negative for abdominal pain.  Genitourinary:  Negative for dyspareunia and dysuria.  Musculoskeletal:  Negative for back pain and neck pain.       Shoulder pain  Skin:  Negative for rash.  Neurological:  Negative for dizziness, weakness and light-headedness.  Hematological:  Negative for adenopathy.  Psychiatric/Behavioral:  Negative for behavioral problems, dysphoric mood and suicidal ideas. The patient is not nervous/anxious.     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 Pylori stool antigen - dx H. Pylori gastritis        Hyperlipidemia    Hypertension    Leg cramping      Social History   Socioeconomic History   Marital status: Widowed    Spouse name: Not on file   Number of children: 2   Years of education: Not on file   Highest education level: Not on file  Occupational History   Not on file  Tobacco Use   Smoking status: Never   Smokeless tobacco: Never  Vaping Use   Vaping status: Never Used  Substance and Sexual Activity   Alcohol use: No   Drug use: No   Sexual activity: Not on file  Other Topics Concern   Not on file  Social History Narrative   Not on file   Social Drivers of Health   Financial Resource Strain: Not on file  Food Insecurity: Not on file  Transportation Needs: Not on file  Physical  Activity: Not on file  Stress: Not on file  Social Connections: Not on file  Intimate Partner Violence: Not on file    Past Surgical History:  Procedure Laterality Date   ABDOMINAL HYSTERECTOMY     APPENDECTOMY     APPENDECTOMY  1962   histerectomy     TUBAL LIGATION  1976    Family History  Problem Relation Age of Onset   COPD Mother    Heart attack Father     No Known Allergies  Current Outpatient Medications on File Prior to Visit  Medication Sig Dispense Refill   amLODipine  (NORVASC ) 5 MG tablet TAKE 1 TABLET (5 MG TOTAL) BY MOUTH DAILY. 90 tablet 1   atorvastatin  (LIPITOR) 10 MG tablet TAKE 1 TABLET BY MOUTH EVERY DAY 90 tablet 3   atorvastatin  (LIPITOR) 20 MG tablet Take 1 tablet (20 mg total) by mouth daily. 90  tablet 3   omeprazole  (PRILOSEC) 40 MG capsule Take 1 capsule (40 mg total) by mouth daily before breakfast. 90 capsule 3   No current facility-administered medications on file prior to visit.    BP 118/80   Pulse 67   Temp 97.9 F (36.6 C) (Oral)   Resp 16   Ht 5' 4 (1.626 m)   SpO2 98%   BMI 28.67 kg/m        Objective:   Physical Exam  General- No acute distress. Pleasant patient. Neck- Full range of motion, no jvd Lungs- Clear, even and unlabored. Heart- regular rate and rhythm. Neurologic- CNII- XII grossly intact.  Rt shoulder- pain on abduction and limited range of motion. Abdomen- soft, nt, nd, +bs, no rebound or guarding.  Back- no cva tenderness.     Assessment & Plan:   Patient Instructions  Right upper arm and shoulder pain(possible rotator cuff etiology) Avoid nsaid with hx of gastritis. Pain for two weeks. No trauma history. - Prescribe Medrol  (methylprednisolone ) for six days.(make sure taking with food) - Order X-ray of the right shoulder and arm. - Refer to sports medicine if pain persists.  Left eye conjunctivitis Irritation and tearing for one month.(negative fluorescein stain. No abrasion) - Recommend optometrist consultation within two to four weeks. Advise self-referral. But if having difficulty getting scheduled or changing signs/symptoms let me know.   History of Helicobacter pylori infection, completed treatment(gasrtitis by egd) Completed 14-day antibiotic treatment. - Consider H. pylori breath test in six to eight weeks if symptoms recur. -continue omeprazole   Follow up in 10 days or sooner if needed   [employer], PA-C

## 2024-01-30 NOTE — Patient Instructions (Signed)
 Right upper arm and shoulder pain(possible rotator cuff etiology) Avoid nsaid with hx of gastritis. Pain for two weeks. No trauma history. - Prescribe Medrol  (methylprednisolone ) for six days.(make sure taking with food) - Order X-ray of the right shoulder and arm. - Refer to sports medicine if pain persists.  Left eye conjunctivitis Irritation and tearing for one month.(negative fluorescein stain. No abrasion) - Recommend optometrist consultation within two to four weeks. Advise self-referral. But if having difficulty getting scheduled or changing signs/symptoms let me know.   History of Helicobacter pylori infection, completed treatment(gasrtitis by egd) Completed 14-day antibiotic treatment. - Consider H. pylori breath test in six to eight weeks if symptoms recur. -continue omeprazole   Follow up in 10 days or sooner if needed

## 2024-02-09 ENCOUNTER — Ambulatory Visit: Admitting: Medical

## 2024-02-09 ENCOUNTER — Encounter: Payer: Self-pay | Admitting: Medical

## 2024-02-09 ENCOUNTER — Encounter: Payer: Self-pay | Admitting: Urology

## 2024-02-09 ENCOUNTER — Ambulatory Visit: Admitting: Urology

## 2024-02-09 VITALS — BP 155/82 | HR 80 | Ht 64.0 in | Wt 164.0 lb

## 2024-02-09 VITALS — BP 138/70 | HR 76 | Temp 98.1°F | Resp 15 | Ht 64.0 in | Wt 164.6 lb

## 2024-02-09 DIAGNOSIS — N390 Urinary tract infection, site not specified: Secondary | ICD-10-CM | POA: Diagnosis not present

## 2024-02-09 DIAGNOSIS — I10 Essential (primary) hypertension: Secondary | ICD-10-CM

## 2024-02-09 DIAGNOSIS — N3281 Overactive bladder: Secondary | ICD-10-CM

## 2024-02-09 DIAGNOSIS — E785 Hyperlipidemia, unspecified: Secondary | ICD-10-CM

## 2024-02-09 DIAGNOSIS — M25511 Pain in right shoulder: Secondary | ICD-10-CM

## 2024-02-09 DIAGNOSIS — R3129 Other microscopic hematuria: Secondary | ICD-10-CM | POA: Diagnosis not present

## 2024-02-09 LAB — URINALYSIS, ROUTINE W REFLEX MICROSCOPIC
Bilirubin, UA: NEGATIVE
Glucose, UA: NEGATIVE
Ketones, UA: NEGATIVE
Leukocytes,UA: NEGATIVE
Nitrite, UA: NEGATIVE
Protein,UA: NEGATIVE
Specific Gravity, UA: 1.015 (ref 1.005–1.030)
Urobilinogen, Ur: 0.2 mg/dL (ref 0.2–1.0)
pH, UA: 6 (ref 5.0–7.5)

## 2024-02-09 LAB — MICROSCOPIC EXAMINATION: RBC, Urine: >30 /HPF — AB (ref 0–2)

## 2024-02-09 MED ORDER — TROSPIUM CHLORIDE 20 MG PO TABS: 20.0000 mg | ORAL_TABLET | Freq: Two times a day (BID) | ORAL | 11 refills | Status: DC

## 2024-02-09 MED ORDER — ESTRADIOL 0.1 MG/GM VA CREA
TOPICAL_CREAM | VAGINAL | 11 refills | Status: AC
Start: 2024-02-09 — End: ?

## 2024-02-09 NOTE — Patient Instructions (Signed)
 Essential hypertension Blood pressure varies; in-office 140/82 mmHg, home 120-130 mmHg. - Continue monitoring blood pressure at home.  Rt shoulder pain resolved with medrol  taper -Range of motion much improved now almost full rom. - no need to refer to sport med as considered last visit. -if pain or decreased rom returns let me know.  General Health Maintenance She declined the annual influenza vaccine . Discussed flu vaccine benefits. - Consider over-the-counter flu tests if flu-like symptoms develop.(Test early within 48 hours of onset)  Follow up 6 months or sooner if needed

## 2024-02-09 NOTE — Progress Notes (Signed)
   Subjective:    Patient ID: Brandi Moon, female    DOB: Mar 02, 1946, 78 y.o.   MRN: [id number]  HPI  Brandi Moon is a 78 year old female who presents with right shoulder pain.  She experienced severe pain in her right shoulder, which limited her ability to lift her arm. The pain had been present for three weeks before she started a six-day course of Medrol . By the third day of medication, she noticed significant improvement, and now she can lift her arm almost completely, although not fully. No pain reported now.  Her blood pressure fluctuates, with home readings typically around 124/130 mmHg,/80 at home but sometimes higher when she is stressed. She notes that her blood pressure is often about 10 points lower at home compared to readings taken in the clinic.   Review of Systems See hpi    Objective:   Physical Exam  General- No acute distress. Pleasant patient. Neck- Full range of motion, no jvd Lungs- Clear, even and unlabored. Heart- regular rate and rhythm. Neurologic- CNII- XII grossly intact.  Rt shoulder- no pain on palpation. On ROM testing no obvious restriction on rom. No pain on palpation ac joint.      Assessment & Plan:   Patient Instructions  Essential hypertension Blood pressure varies; in-office 140/82 mmHg, home 120-130 mmHg. - Continue monitoring blood pressure at home.  Rt shoulder pain resolved with medrol  taper -Range of motion much improved now almost full rom. - no need to refer to sport med as considered last visit. -if pain or decreased rom returns let me know.  General Health Maintenance She declined the annual influenza vaccine . Discussed flu vaccine benefits. - Consider over-the-counter flu tests if flu-like symptoms develop.(Test early within 48 hours of onset)  Follow up 6 months or sooner if needed

## 2024-02-09 NOTE — Progress Notes (Signed)
 Assessment: 1. Recurrent UTI   2. OAB (overactive bladder)   3. Microscopic hematuria - negative urologic evaluation; likley nephrologic etiology    Plan: Trial of trospium  20 mg twice daily for management of OAB.  Prescription sent. Continue UTI prevention with vaginal estrogen cream and probiotic. Return to office in 6 weeks.  Chief Complaint: Chief Complaint  Patient presents with   Recurrent UTI    HPI: Brandi Moon is a 78 y.o. female who presents for continued evaluation of recurrent UTI and OAB. She was previously followed by Dr. Shona and was last seen in February 2025.  She has a history of recurrent UTIs and was initially seen in June 2024.  She was started on daily antibiotic prophylaxis with single strength Bactrim  and vaginal hormone cream at that time. She continued on vaginal hormone cream and a probiotic.  She has not had any breakthrough UTIs.  She underwent evaluation for microscopic hematuria in July 2024. CT showed no renal or ureteral calculi, no renal masses and no evidence of obstruction or filling defect.  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 Cystoscopy was not performed.  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 Hematuria profile from 10/24 was negative for malignant cells or dysplasia.  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 Cystoscopy from 10/24 showed a normal urethra and bladder without any mucosal lesions. She was referred to nephrology but had not followed up with the referral at the time of her visit in February 2025. She was seen by nephrology on 11/15/2023.  She has a history of overactive bladder symptoms with associated incontinence.  She was restarted on Vesicare  10 mg daily in February 2025.  At her visit in June 2025, she  continued to have symptoms of frequency, urgency, and incontinence.  She was also having problems with constipation since she started the Vesicare .  No dysuria or gross hematuria.  No recent UTIs. The Vesicare  was discontinued and she was started on Myrbetriq  50 mg daily. She was unable to take the medication due to insurance coverage.  She returns today for follow-up.  She continues to have frequency, urgency, urge incontinence.  She did have some dysuria which has resolved.  No gross hematuria or flank pain.  Interpreter was used during visit.  Portions of the above documentation were copied from a prior visit for review purposes only.  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 Pylori stool antigen - dx H. Pylori gastritis        Hyperlipidemia    Hypertension    Leg cramping     PSH: Past Surgical History:  Procedure Laterality Date   ABDOMINAL HYSTERECTOMY     APPENDECTOMY     APPENDECTOMY  1962   histerectomy  TUBAL LIGATION  1976    SH: Social History   Tobacco Use   Smoking status: Never   Smokeless tobacco: Never  Vaping Use   Vaping status: Never Used  Substance Use Topics   Alcohol use: No   Drug use: No    ROS: Constitutional:  Negative for fever, chills, weight loss CV: Negative for chest pain, previous MI, hypertension Respiratory:  Negative for shortness of breath, wheezing, sleep apnea, frequent cough GI:  Negative for nausea, vomiting, bloody stool, GERD  PE: BP (!) 155/82   Pulse 80   Ht 5' 4 (1.626 m)   Wt 164 lb (74.4 kg)    BMI 28.15 kg/m  GENERAL APPEARANCE:  Well appearing, well developed, well nourished, NAD HEENT:  Atraumatic, normocephalic, oropharynx clear NECK:  Supple without lymphadenopathy or thyromegaly ABDOMEN:  Soft, non-tender, no masses EXTREMITIES:  Moves all extremities well, without clubbing, cyanosis, or edema NEUROLOGIC:  Alert and oriented x 3, normal gait, CN II-XII grossly intact MENTAL STATUS:  appropriate BACK:  Non-tender to palpation, No CVAT SKIN:  Warm, dry, and intact  Results: U/A: 0-5 WBCs, >30 RBCs, few bacteria

## 2024-02-15 ENCOUNTER — Telehealth: Payer: Self-pay | Admitting: Medical

## 2024-02-15 NOTE — Telephone Encounter (Signed)
 Copied from CRM [id number]. Topic: Medicare AWV >> Feb 15, 2024 10:11 AM Nathanel DEL wrote: Reason for CRM: Called LVM 02/15/2024 to schedule AWV. Please schedule Virtual or Telehealth visits ONLY.   Nathanel Paschal; Care Guide Ambulatory Clinical Support [facility] Direct Dial: [phone]

## 2024-02-19 ENCOUNTER — Other Ambulatory Visit (HOSPITAL_BASED_OUTPATIENT_CLINIC_OR_DEPARTMENT_OTHER): Payer: Self-pay

## 2024-02-23 ENCOUNTER — Encounter (HOSPITAL_BASED_OUTPATIENT_CLINIC_OR_DEPARTMENT_OTHER): Payer: Self-pay

## 2024-02-23 ENCOUNTER — Observation Stay (HOSPITAL_BASED_OUTPATIENT_CLINIC_OR_DEPARTMENT_OTHER)
Admission: EM | Admit: 2024-02-23 | Discharge: 2024-02-24 | Disposition: A | Discharge status: Home / Self Care | Attending: Internal Medicine | Admitting: Internal Medicine

## 2024-02-23 ENCOUNTER — Emergency Department (HOSPITAL_BASED_OUTPATIENT_CLINIC_OR_DEPARTMENT_OTHER)

## 2024-02-23 ENCOUNTER — Other Ambulatory Visit: Payer: Self-pay

## 2024-02-23 ENCOUNTER — Telehealth: Payer: Self-pay

## 2024-02-23 DIAGNOSIS — R109 Unspecified abdominal pain: Secondary | ICD-10-CM | POA: Diagnosis present

## 2024-02-23 DIAGNOSIS — K921 Melena: Secondary | ICD-10-CM | POA: Diagnosis not present

## 2024-02-23 DIAGNOSIS — Z743 Need for continuous supervision: Secondary | ICD-10-CM | POA: Diagnosis not present

## 2024-02-23 DIAGNOSIS — D500 Iron deficiency anemia secondary to blood loss (chronic): Secondary | ICD-10-CM | POA: Diagnosis not present

## 2024-02-23 DIAGNOSIS — I700 Atherosclerosis of aorta: Secondary | ICD-10-CM | POA: Diagnosis not present

## 2024-02-23 DIAGNOSIS — D649 Anemia, unspecified: Secondary | ICD-10-CM

## 2024-02-23 DIAGNOSIS — N3000 Acute cystitis without hematuria: Secondary | ICD-10-CM

## 2024-02-23 DIAGNOSIS — B9681 Helicobacter pylori [H. pylori] as the cause of diseases classified elsewhere: Secondary | ICD-10-CM | POA: Diagnosis present

## 2024-02-23 DIAGNOSIS — K922 Gastrointestinal hemorrhage, unspecified: Secondary | ICD-10-CM | POA: Diagnosis present

## 2024-02-23 DIAGNOSIS — I10 Essential (primary) hypertension: Secondary | ICD-10-CM | POA: Diagnosis not present

## 2024-02-23 DIAGNOSIS — R300 Dysuria: Secondary | ICD-10-CM | POA: Diagnosis not present

## 2024-02-23 DIAGNOSIS — K2971 Gastritis, unspecified, with bleeding: Secondary | ICD-10-CM | POA: Insufficient documentation

## 2024-02-23 DIAGNOSIS — N3090 Cystitis, unspecified without hematuria: Secondary | ICD-10-CM | POA: Diagnosis not present

## 2024-02-23 DIAGNOSIS — K8020 Calculus of gallbladder without cholecystitis without obstruction: Secondary | ICD-10-CM | POA: Insufficient documentation

## 2024-02-23 DIAGNOSIS — Z79899 Other long term (current) drug therapy: Secondary | ICD-10-CM | POA: Insufficient documentation

## 2024-02-23 DIAGNOSIS — K219 Gastro-esophageal reflux disease without esophagitis: Secondary | ICD-10-CM | POA: Insufficient documentation

## 2024-02-23 LAB — URINALYSIS, ROUTINE W REFLEX MICROSCOPIC
Bilirubin Urine: NEGATIVE
Glucose, UA: 100 mg/dL — AB
Ketones, ur: NEGATIVE mg/dL
Nitrite: POSITIVE — AB
Protein, ur: 100 mg/dL — AB
Specific Gravity, Urine: 1.01 (ref 1.005–1.030)
pH: 5.5 (ref 5.0–8.0)

## 2024-02-23 LAB — COMPREHENSIVE METABOLIC PANEL WITH GFR
ALT: 13 U/L (ref 0–44)
AST: 17 U/L (ref 15–41)
Albumin: 3.5 g/dL (ref 3.5–5.0)
Alkaline Phosphatase: 72 U/L (ref 38–126)
Anion gap: 12 (ref 5–15)
BUN: 16 mg/dL (ref 8–23)
CO2: 25 mmol/L (ref 22–32)
Calcium: 9.1 mg/dL (ref 8.9–10.3)
Chloride: 105 mmol/L (ref 98–111)
Creatinine, Ser: 0.99 mg/dL (ref 0.44–1.00)
GFR, Estimated: 58 mL/min — ABNORMAL LOW (ref >60)
Glucose, Bld: 97 mg/dL (ref 70–99)
Potassium: 4.1 mmol/L (ref 3.5–5.1)
Sodium: 141 mmol/L (ref 135–145)
Total Bilirubin: 0.4 mg/dL (ref 0.0–1.2)
Total Protein: 7 g/dL (ref 6.5–8.1)

## 2024-02-23 LAB — CBC WITH DIFFERENTIAL/PLATELET
Abs Immature Granulocytes: 0.08 K/uL — ABNORMAL HIGH (ref 0.00–0.07)
Basophils Absolute: 0 K/uL (ref 0.0–0.1)
Basophils Relative: 0 %
Eosinophils Absolute: 0.2 K/uL (ref 0.0–0.5)
Eosinophils Relative: 1 %
HCT: 15.2 % — ABNORMAL LOW (ref 36.0–46.0)
Hemoglobin: 4.8 g/dL — CL (ref 12.0–15.0)
Immature Granulocytes: 1 %
Lymphocytes Relative: 25 %
Lymphs Abs: 4.2 K/uL — ABNORMAL HIGH (ref 0.7–4.0)
MCH: 26.5 pg (ref 26.0–34.0)
MCHC: 31.6 g/dL (ref 30.0–36.0)
MCV: 84 fL (ref 80.0–100.0)
Monocytes Absolute: 1.1 K/uL — ABNORMAL HIGH (ref 0.1–1.0)
Monocytes Relative: 7 %
Neutro Abs: 10.8 K/uL — ABNORMAL HIGH (ref 1.7–7.7)
Neutrophils Relative %: 66 %
Platelets: 504 K/uL — ABNORMAL HIGH (ref 150–400)
RBC: 1.81 MIL/uL — ABNORMAL LOW (ref 3.87–5.11)
RDW: 14 % (ref 11.5–15.5)
WBC: 16.4 K/uL — ABNORMAL HIGH (ref 4.0–10.5)
nRBC: 0 % (ref 0.0–0.2)

## 2024-02-23 LAB — URINALYSIS, MICROSCOPIC (REFLEX): WBC, UA: >50 WBC/hpf (ref 0–5)

## 2024-02-23 LAB — LACTIC ACID, PLASMA
Lactic Acid, Venous: 1.1 mmol/L (ref 0.5–1.9)
Lactic Acid, Venous: 1.4 mmol/L (ref 0.5–1.9)

## 2024-02-23 LAB — ABO/RH: ABO/RH(D): O POS

## 2024-02-23 LAB — TSH: TSH: 3.065 u[IU]/mL (ref 0.350–4.500)

## 2024-02-23 LAB — PREPARE RBC (CROSSMATCH)

## 2024-02-23 MED ORDER — ACETAMINOPHEN 650 MG RE SUPP: 650.0000 mg | Freq: Four times a day (QID) | RECTAL | Status: DC | PRN

## 2024-02-23 MED ORDER — PANTOPRAZOLE SODIUM 40 MG IV SOLR
40.0000 mg | Freq: Once | INTRAVENOUS | Status: AC
  Administered 2024-02-23: 40 mg via INTRAVENOUS
  Filled 2024-02-23: qty 10

## 2024-02-23 MED ORDER — ACETAMINOPHEN 325 MG PO TABS
650.0000 mg | ORAL_TABLET | Freq: Four times a day (QID) | ORAL | Status: DC | PRN
  Administered 2024-02-23 – 2024-02-24 (×2): 650 mg via ORAL
  Filled 2024-02-23 (×2): qty 2

## 2024-02-23 MED ORDER — SODIUM CHLORIDE 0.9% IV SOLUTION: Freq: Once | INTRAVENOUS | Status: DC

## 2024-02-23 MED ORDER — SODIUM CHLORIDE 0.9 % IV SOLN
2.0000 g | Freq: Once | INTRAVENOUS | Status: AC
  Administered 2024-02-23: 2 g via INTRAVENOUS
  Filled 2024-02-23: qty 20

## 2024-02-23 MED ORDER — SODIUM CHLORIDE 0.9 % IV SOLN
1.0000 g | INTRAVENOUS | Status: DC
  Administered 2024-02-24: 1 g via INTRAVENOUS
  Filled 2024-02-23: qty 10

## 2024-02-23 MED ORDER — SENNOSIDES-DOCUSATE SODIUM 8.6-50 MG PO TABS: 1.0000 | ORAL_TABLET | Freq: Every evening | ORAL | Status: DC | PRN

## 2024-02-23 MED ORDER — LACTATED RINGERS IV SOLN: INTRAVENOUS | Status: DC

## 2024-02-23 MED ORDER — IOHEXOL 300 MG/ML  SOLN
80.0000 mL | Freq: Once | INTRAMUSCULAR | Status: AC | PRN
  Administered 2024-02-23: 80 mL via INTRAVENOUS

## 2024-02-23 MED ORDER — FENTANYL CITRATE PF 50 MCG/ML IJ SOSY
25.0000 ug | PREFILLED_SYRINGE | Freq: Once | INTRAMUSCULAR | Status: AC
  Administered 2024-02-23: 25 ug via INTRAVENOUS
  Filled 2024-02-23: qty 1

## 2024-02-23 MED ORDER — PANTOPRAZOLE SODIUM 40 MG IV SOLR
40.0000 mg | Freq: Every day | INTRAVENOUS | Status: DC
Start: 2024-02-23 — End: 2024-02-24
  Administered 2024-02-23 – 2024-02-24 (×2): 40 mg via INTRAVENOUS
  Filled 2024-02-23 (×2): qty 10

## 2024-02-23 MED ORDER — LACTATED RINGERS IV BOLUS (SEPSIS)
1000.0000 mL | Freq: Once | INTRAVENOUS | Status: AC
Start: 2024-02-23 — End: 2024-02-24
  Administered 2024-02-23: 1000 mL via INTRAVENOUS

## 2024-02-23 MED ORDER — ATORVASTATIN CALCIUM 10 MG PO TABS
20.0000 mg | ORAL_TABLET | Freq: Every day | ORAL | Status: DC
  Administered 2024-02-23 – 2024-02-24 (×2): 20 mg via ORAL
  Filled 2024-02-23 (×2): qty 2

## 2024-02-23 NOTE — Telephone Encounter (Signed)
 Pt daughter called stating pt is in a lot of pain, leaking urine all the time, and not able to sleep d/t discomfort. Daughter voiced concern of pt having a UTI. Daughter denied pt to have n/v, f/c. Nurse requested daughter press on pt lower abd to see if pain was present. Daughter stated that she is not currently with pt, but pt has not been c/o lower abd pain. Pt has only been c/o feeling like needles are stabbing her vaginal area every time urine leaks. Nurse reinforced with daughter that Dr. Roseann started pt on a new medication on 02/09/24, therefore concerns of urinary retention. Daughter stated she was more worried about pt having a UTI. Nurse reinforced with daughter what urinary retention is and how it can lead to a UTI, therefore both concerns should be addressed. Encouraged daughter to take pt to a [facility] so all urology notes can be seen. Dr. Roseann is not in the office today so clinic is not able to see pt. Daughter voiced understanding and stated she would take pt to [facility] ER.

## 2024-02-23 NOTE — ED Notes (Signed)
 ED staff attempted to stick pt a total of 5x to obtain blood cultures. Able to get first set of cultures prior to starting antibiotics. Pt currently has x2 PIVs and abx running. Unable to get second set of cultures.

## 2024-02-23 NOTE — ED Triage Notes (Addendum)
 C/o dysuria x 1 week. Lower abdominal pain, lower back pain since yesterday.  Spanish speaking

## 2024-02-23 NOTE — ED Notes (Signed)
 Called [facility] for transport to [facility] @16 :06.  Spoke with Taren

## 2024-02-23 NOTE — ED Provider Notes (Signed)
 Brandi Moon Provider Note   CSN: [id number] Arrival date & time: 02/23/24  1012     History Chief Complaint  Patient presents with   Dysuria    HPI: Brandi Moon is a 78 y.o. female with history pertinent for recurrent UTI, HTN, HLD, chronic urinary incontinence, GERD who presents complaining of lower abdominal pain as well as vulvar pain. Patient arrived via POV accompanied by daughter-in-law.  History provided by patient and relative: Daughter-in-law.  Patient declined Spanish interpreter.  Prefers for daughter-in-law at bedside to interpret.  Patient has had chronic urinary incontinence.  Daughter and patient report that she has had worsening lower abdominal pain radiating to her vulva over the past several days.  Reports that she has tried OTC equate phenazopyridine as well as a topical vaginal cream that was recently prescribed (trospium  cream per chart review) which has also not helped.  Reports that the pain has worsened, however she has not had any fevers, chills, nausea, vomiting, diarrhea, chest pain, shortness of breath, so they decided to come to the emergency department.  Daughter-in-law reports that she also called the patient's urology practice who reportedly told her that they would put a note in the chart.  Patient's recorded medical, surgical, social, medication list and allergies were reviewed in the Snapshot window as part of the initial history.   Prior to Admission medications   Medication Sig Start Date End Date Taking? Authorizing Provider  amLODipine  (NORVASC ) 5 MG tablet TAKE 1 TABLET (5 MG TOTAL) BY MOUTH DAILY. 01/18/24   Saguier, [place], PA-C  atorvastatin  (LIPITOR) 10 MG tablet TAKE 1 TABLET BY MOUTH EVERY DAY 01/18/24   Saguier, [place], PA-C  atorvastatin  (LIPITOR) 20 MG tablet Take 1 tablet (20 mg total) by mouth daily. 07/28/23   Saguier, Dallas, PA-C  estradiol  (ESTRACE ) 0.1 MG/GM vaginal cream Apply 1 gm to  vaginal area 2-3 times/week 02/09/24   Roseann Adine PARAS., MD  methylPREDNISolone  (MEDROL ) 4 MG tablet Standard 6 day dose pack 01/30/24   Saguier, Dallas, PA-C  omeprazole  (PRILOSEC) 40 MG capsule Take 1 capsule (40 mg total) by mouth daily before breakfast. 01/02/24   Avram Lupita BRAVO, MD  tobramycin  (TOBREX ) 0.3 % ophthalmic solution Place 2 drops into the left eye every 6 (six) hours. 01/30/24   Saguier, [place], PA-C  trospium  (SANCTURA ) 20 MG tablet Take 1 tablet (20 mg total) by mouth 2 (two) times daily. 02/09/24   Stoneking, Adine PARAS., MD     Allergies: Patient has no known allergies.   Review of Systems   ROS as per HPI  Physical Exam Updated Vital Signs BP (!) 156/61 (BP Location: Right Arm)   Pulse 87   Temp 98.7 F (37.1 C) (Oral)   Resp 20   Ht 5' 4 (1.626 m)   Wt 75.8 kg   SpO2 98%   BMI 28.67 kg/m  Physical Exam Vitals and nursing note reviewed.  Constitutional:      General: She is not in acute distress.    Appearance: She is well-developed.  HENT:     Head: Normocephalic and atraumatic.  Eyes:     Conjunctiva/sclera: Conjunctivae normal.  Cardiovascular:     Rate and Rhythm: Normal rate and regular rhythm.     Heart sounds: No murmur heard. Pulmonary:     Effort: Pulmonary effort is normal. No respiratory distress.     Breath sounds: Normal breath sounds.  Abdominal:     Palpations: Abdomen is  soft.     Tenderness: There is abdominal tenderness (Moderate in lower abdomen) in the right lower quadrant, suprapubic area and left lower quadrant. There is guarding (Voluntary). There is no rebound.  Musculoskeletal:        General: No swelling.     Cervical back: Neck supple.  Skin:    General: Skin is warm and dry.     Capillary Refill: Capillary refill takes less than 2 seconds.  Neurological:     Mental Status: She is alert.  Psychiatric:        Mood and Affect: Mood normal.     ED Course/ Medical Decision Making/ A&P    Procedures Procedures    Medications Ordered in ED Medications  lactated ringers  infusion ( Intravenous New Bag/Given 02/23/24 1359)  0.9 %  sodium chloride  infusion (Manually program via Guardrails IV Fluids) (has no administration in time range)  iohexol  (OMNIPAQUE ) 300 MG/ML solution 80 mL (80 mLs Intravenous Contrast Given 02/23/24 1217)  lactated ringers  bolus 1,000 mL (1,000 mLs Intravenous New Bag/Given 02/23/24 1237)  cefTRIAXone  (ROCEPHIN ) 2 g in sodium chloride  0.9 % 100 mL IVPB (0 g Intravenous Stopped 02/23/24 1315)  pantoprazole  (PROTONIX ) injection 40 mg (40 mg Intravenous Given 02/23/24 1306)  fentaNYL  (SUBLIMAZE ) injection 25 mcg (25 mcg Intravenous Given 02/23/24 1317)    Medical Decision Making:   Brandi Moon is a 78 y.o. female who presents for dysuria as per above.  Physical exam is pertinent for tenderness to palpation in the lower abdomen with voluntary guarding and without rebound.   The differential includes but is not limited to cystitis, pyelonephritis, nephrolithiasis, intra-abdominal abscess, bowel obstruction, sepsis.  Independent historian: Relative: Daughter-in-law  External data reviewed: Notes: Reviewed urology note from 9/12  Phone note from urology nurse 9/26: Pt daughter called stating pt is in a lot of pain, leaking urine all the time, and not able to sleep d/t discomfort. Daughter voiced concern of pt having a UTI. Daughter denied pt to have n/v, f/c. Nurse requested daughter press on pt lower abd to see if pain was present. Daughter stated that she is not currently with pt, but pt has not been c/o lower abd pain. Pt has only been c/o feeling like needles are stabbing her vaginal area every time urine leaks. Nurse reinforced with daughter that Dr. Roseann started pt on a new medication on 02/09/24, therefore concerns of urinary retention. Daughter stated she was more worried about pt having a UTI. Nurse reinforced with daughter what urinary retention is and how it can lead to  a UTI, therefore both concerns should be addressed. Encouraged daughter to take pt to a [facility] so all urology notes can be seen. Dr. Roseann is not in the office today so clinic is not able to see pt. Daughter voiced understanding and stated she would take pt to [facility] ER.      Initial Plan:   Screening labs including CBC and Metabolic panel to evaluate for infectious or metabolic etiology of disease.  Urinalysis with reflex culture ordered to evaluate for UTI or relevant urologic/nephrologic pathology.  CT to evaluate for structural/infectious intra-abdominal pathology.   Labs: Ordered, Independent interpretation, and Details: UA with concern for UTI with positive nitrites, LE, numerous bacteria and greater than 50 WBCs.  Lactic acid WNL.  CMP without AKI, emergent electrolyte derangement, emergent LFT abnormality.  CBC with significant leukocytosis to 16.4 with left shift.  Thrombocytosis.  New anemia to 4.8, baseline 12  Radiology:  Ordered, Independent interpretation, Details: Personally viewed CT of the abdomen and pelvis, I do appreciate thickening and slight hyperenhancement of the bladder wall consistent with cystitis., and All images reviewed independently.  Agree with radiology report at this time.   CT ABDOMEN PELVIS W CONTRAST Result Date: 02/23/2024 CLINICAL DATA:  LLQ abdominal pain RLQ abdominal pain UTI, recurrent/complicated (Female) diffuse lower abdominal pain EXAM: CT ABDOMEN AND PELVIS WITH CONTRAST TECHNIQUE: Multidetector CT imaging of the abdomen and pelvis was performed using the standard protocol following bolus administration of intravenous contrast. RADIATION DOSE REDUCTION: This exam was performed according to the departmental dose-optimization program which includes automated exposure control, adjustment of the mA and/or kV according to patient size and/or use of iterative reconstruction technique. CONTRAST:  80mL OMNIPAQUE  IOHEXOL  300 MG/ML  SOLN  COMPARISON:  12/04/2022 FINDINGS: Lower chest: No focal airspace consolidation or pleural effusion. Hepatobiliary: Subcentimeter hypodensity in the posterior right hepatic lobe, unchanged, likely a small cyst or hemangioma.A few small radiopaque gallstones. No wall thickening. No intrahepatic or extrahepatic biliary ductal dilation. The portal veins are patent. Pancreas: No mass or main ductal dilation. No peripancreatic inflammation or fluid collection. Spleen: Normal size. No mass. Adrenals/Urinary Tract: No adrenal masses. Multiple bilateral peripelvic renal cysts. No nephrolithiasis or hydronephrosis. Circumferential wall thickening of the urinary bladder with urothelial enhancement in the upstream ureters. Stomach/Bowel: The stomach is decompressed without focal abnormality. No small bowel wall thickening or inflammation. No small bowel obstruction.The appendix was not visualized. No right lower quadrant or pericecal inflammatory changes to suggest acute appendicitis. Scattered colonic diverticulosis. No changes of acute diverticulitis. Vascular/Lymphatic: No aortic aneurysm. Diffuse aortoiliac atherosclerosis. No intraabdominal or pelvic lymphadenopathy. Reproductive: Age-related atrophy of the uterus and ovaries. No concerning adnexal mass.No free pelvic fluid. Other: No pneumoperitoneum or ascites. Musculoskeletal: No acute fracture or destructive lesion. L5-S1 degenerative disc disease. IMPRESSION: 1. Acute cystitis with an ascending urinary tract infection. No hydronephrosis or nephrolithiasis. 2. Cholecystolithiasis.  No changes of acute cholecystitis. Aortic Atherosclerosis (ICD10-I70.0). Electronically Signed   By: Rogelia Myers M.D.   On: 02/23/2024 13:19   EKG/Medicine tests: Ordered, Independent interpretation, and EKG was reviewed independently. Rate, rhythm, axis, intervals all examined and without medically relevant abnormality. ST segments without concerns for elevations.    Interventions:  LR bolus, ceftriaxone , Protonix , fentanyl   See the EMR for full details regarding lab and imaging results.  Patient presents for, dysuria.  Patient with history of incontinence and recurrent UTIs.  Do feel that screening labs ordered, patient does have significant tenderness as well as voluntary guarding on exam, therefore do feel that CT of the abdomen and pelvis is warranted to evaluate for further intra-abdominal process.  Labs and imaging obtained, UA consistent with Vitas, therefore patient initiated sepsis without septic shock, and was administered ceftriaxone .  Patient was administered 1 L LR rather than 30 cc/kg bolus given lactic acid was within normal limits and patient is hemodynamically stable.  Labs reveal a leukocytosis consistent with cystitis, however also reveal a unexpected anemia.  Patient reevaluated and re-discussed with patient and daughter at this time.  Patient reports that approximately 1 month ago she was diagnosed with H. pylori, and since then has had some melena, however no hematemesis, coffee-ground emesis.  Has not had bright red blood per rectum.  Patient without symptomatic anemia symptoms are limited to lower abdominal pain and dysuria.  Given this additional history we will order transfusion, patient and daughter-in-law at bedside amenable, patient ordered crossmatch, and Protonix .  Do not  feel that warrants emergency release blood given patient is hemodynamically stable and has no symptomatic anemia at this time.  [facility] consulted for admission, discussed patient with Dr. Zella, who accepted the patient for admission.  Requested gastroenterology consult.  I consulted [facility] who states that that they will reviewed the patient's chart, did not have any acute recommendations.  Request that  be repaged from patient arrives at [facility].  This request was relayed to Dr. Zella.  Patient's daughter did present to the emergency department as well,  and expressed concern regarding the patient's status.  States that patient previously had hemorrhage shock in 1992, and she is worried about recurrence.  Assured patient and daughter that currently she is hemodynamically stable, and does not require emergent transfusion, and that we are working on admission at [facility].  Daughter requests that patient be transferred ED to ED to [facility].  I did speak with the nurse manager as well as ED physician at [facility], and patient was initially accepted a ED to ED transfer, however prior to being able to arrange transport, bed became available inpatient at [facility], therefore will admit patient directly from [facility] ED to [facility] rather than additional stop in the [facility] ED.  Presentation is most consistent with acute complicated illness, Presentation is most consistent with acute life/limb-threatening illness, and Current presentation is complicated by underlying chronic conditions  Discussion of management or test interpretations with external provider(s): Dr. Zella, hospitalist, [facility] gastroenterology  Risk Drugs:Prescription drug management and Parenteral controlled substances Treatment: Decision regarding hospitalization  Disposition: ADMIT: I believe the patient requires admission for further care and management. The patient was admitted to [facility] [address] Please see inpatient provider note for additional treatment plan details.   MDM generated using voice dictation software and may contain dictation errors.  Please contact me for any clarification or with any questions.  Clinical Impression:  1. Cystitis   2. Anemia, unspecified type   3. Melena      Admit   Final Clinical Impression(s) / ED Diagnoses Final diagnoses:  Cystitis  Anemia, unspecified type  Melena    Rx / DC Orders ED Discharge Orders     None        Rogelia Jerilynn RAMAN, MD 02/23/24 1914

## 2024-02-23 NOTE — ED Notes (Signed)
 ED Provider at bedside.

## 2024-02-23 NOTE — Sepsis Progress Note (Signed)
 Code Sepsis protocol being monitored by eLink.

## 2024-02-23 NOTE — ED Notes (Signed)
 Patient transported to CT

## 2024-02-23 NOTE — ED Notes (Signed)
Lab notified of urine culture add on 

## 2024-02-23 NOTE — Progress Notes (Addendum)
 Patient arrived to unit from [facility] paged [facility] admitting. Awaiting for type and screen order.    Placed Type and screen order per protocol. Last hemoglobin 4.8

## 2024-02-23 NOTE — H&P (Signed)
 History and Physical    Brandi Moon FMW:[id number] DOB: 07-Apr-1946 DOA: 02/23/2024  DOS: the patient was seen and examined on 02/23/2024  PCP: Dorina Loving, PA-C   Patient coming from: [facility]  I have personally briefly reviewed patient's old medical records in [facility] and CareEverywhere  HPI:  No notes on file   Brandi Moon is a 78 y.o. year old female with past medical history of HTN, HLD, remote history of uterine cancer, recurrent UTIs coming to the ED with fatigue and malaise. States she had dark stools after starting antibiotics for H. Pylori infection and has had them for 6 weeks but none since the last 2 days. She is chronically constipated with going 5 days at a time without a bowel movement. Denies other GI symptoms including n/v/d.    ED Course: Patient presented [facility] and was directly admitted to [facility] given she had a hemoglobin 4.8.  Patient was hemodynamically stable and satting well on room air.  Imaging obtained there showed acute cystitis and gallstones without cholecystitis.  Lactic acid was normal, CMP otherwise normal.  UA shows positive nitrites and leukocytes and urine cultures pending.  TRH was consulted for admission.   Review of Systems: As mentioned in the history of present illness. All other systems reviewed and are negative.    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 Pylori stool antigen - dx H. Pylori gastritis        Hyperlipidemia    Hypertension    Leg cramping     Past Surgical History:  Procedure Laterality Date    ABDOMINAL HYSTERECTOMY     APPENDECTOMY     APPENDECTOMY  1962   histerectomy     TUBAL LIGATION  1976     reports that she has never smoked. She has never used smokeless tobacco. She reports that she does not drink alcohol and does not use drugs.  No Known Allergies  Family History  Problem Relation Age of Onset   COPD Mother    Heart attack Father     Prior to Admission medications   Medication Sig Start Date End Date Taking? Authorizing Provider  amLODipine  (NORVASC ) 5 MG tablet TAKE 1 TABLET (5 MG TOTAL) BY MOUTH DAILY. 01/18/24   Saguier, Loving, PA-C  atorvastatin  (LIPITOR) 10 MG tablet TAKE 1 TABLET BY MOUTH EVERY DAY 01/18/24   Saguier, Loving, PA-C  atorvastatin  (LIPITOR) 20 MG tablet Take 1 tablet (20 mg total) by mouth daily. 07/28/23   Saguier, Loving, PA-C  estradiol  (ESTRACE ) 0.1 MG/GM vaginal cream Apply 1 gm to vaginal area 2-3 times/week 02/09/24   Roseann Adine PARAS., MD  methylPREDNISolone  (MEDROL ) 4 MG tablet Standard 6 day dose pack 01/30/24   Saguier, Loving, PA-C  omeprazole  (PRILOSEC) 40 MG capsule Take 1 capsule (40 mg total) by mouth daily before breakfast. 01/02/24   Avram Lupita BRAVO, MD  tobramycin  (TOBREX ) 0.3 % ophthalmic  solution Place 2 drops into the left eye every 6 (six) hours. 01/30/24   Saguier, [place], PA-C  trospium  (SANCTURA ) 20 MG tablet Take 1 tablet (20 mg total) by mouth 2 (two) times daily. 02/09/24   Roseann Adine PARAS., MD    Physical Exam: Vitals:   02/23/24 2100 02/23/24 2200 02/23/24 2205 02/23/24 2300  BP: (!) 169/68 (!) 168/75 (!) 174/78 (!) 158/70  Pulse: 86 85 84 81  Resp: 14 20 16 15   Temp:    98.5 F (36.9 C)  TempSrc:    Oral  SpO2: 97% 96% 97% 96%  Weight:      Height:        Physical Exam: Gen: NAD HENT: Dry mucous membranes CV: Normal heart sounds, sinus rhythm, good pulses Resp: Clear lung sounds, no wheezes rales Abd: No TTP of the abdomen, normal bowel sounds MSK: No symmetry, decreased bulk and tone Skin: No  rash Neuro: Alert and oriented x 4 Psych: Depressed mood   Labs on Admission: I have personally reviewed following labs and imaging studies  CBC: Recent Labs  Lab 02/23/24 1145  WBC 16.4*  NEUTROABS 10.8*  HGB 4.8*  HCT 15.2*  MCV 84.0  PLT 504*   Basic Metabolic Panel: Recent Labs  Lab 02/23/24 1058  NA 141  K 4.1  CL 105  CO2 25  GLUCOSE 97  BUN 16  CREATININE 0.99  CALCIUM  9.1   GFR: Estimated Creatinine Clearance: 46.7 mL/min (by C-G formula based on SCr of 0.99 mg/dL). Liver Function Tests: Recent Labs  Lab 02/23/24 1058  AST 17  ALT 13  ALKPHOS 72  BILITOT 0.4  PROT 7.0  ALBUMIN 3.5   No results for input(s): LIPASE, AMYLASE in the last 168 hours. No results for input(s): AMMONIA in the last 168 hours. Coagulation Profile: No results for input(s): INR, PROTIME in the last 168 hours. Cardiac Enzymes: No results for input(s): CKTOTAL, CKMB, CKMBINDEX, TROPONINI, TROPONINIHS in the last 168 hours. BNP (last 3 results) No results for input(s): BNP in the last 8760 hours. HbA1C: No results for input(s): HGBA1C in the last 72 hours. CBG: No results for input(s): GLUCAP in the last 168 hours. Lipid Profile: No results for input(s): CHOL, HDL, LDLCALC, TRIG, CHOLHDL, LDLDIRECT in the last 72 hours. Thyroid  Function Tests: Recent Labs    02/23/24 1936  TSH 3.065   Anemia Panel: No results for input(s): VITAMINB12, FOLATE, FERRITIN, TIBC, IRON, RETICCTPCT in the last 72 hours. Urine analysis:    Component Value Date/Time   COLORURINE ORANGE (A) 02/23/2024 1025   APPEARANCEUR CLOUDY (A) 02/23/2024 1025   APPEARANCEUR Clear 02/09/2024 0000   LABSPEC 1.010 02/23/2024 1025   PHURINE 5.5 02/23/2024 1025   GLUCOSEU 100 (A) 02/23/2024 1025   HGBUR MODERATE (A) 02/23/2024 1025   BILIRUBINUR NEGATIVE 02/23/2024 1025   BILIRUBINUR Negative 02/09/2024 0000   KETONESUR NEGATIVE 02/23/2024 1025   PROTEINUR  100 (A) 02/23/2024 1025   UROBILINOGEN 0.2 11/12/2022 1032   NITRITE POSITIVE (A) 02/23/2024 1025   LEUKOCYTESUR LARGE (A) 02/23/2024 1025    Radiological Exams on Admission: I have personally reviewed images CT ABDOMEN PELVIS W CONTRAST Result Date: 02/23/2024 CLINICAL DATA:  LLQ abdominal pain RLQ abdominal pain UTI, recurrent/complicated (Female) diffuse lower abdominal pain EXAM: CT ABDOMEN AND PELVIS WITH CONTRAST TECHNIQUE: Multidetector CT imaging of the abdomen and pelvis was performed using the standard protocol following bolus administration of intravenous contrast. RADIATION DOSE REDUCTION: This exam was performed according to the departmental  dose-optimization program which includes automated exposure control, adjustment of the mA and/or kV according to patient size and/or use of iterative reconstruction technique. CONTRAST:  80mL OMNIPAQUE  IOHEXOL  300 MG/ML  SOLN COMPARISON:  12/04/2022 FINDINGS: Lower chest: No focal airspace consolidation or pleural effusion. Hepatobiliary: Subcentimeter hypodensity in the posterior right hepatic lobe, unchanged, likely a small cyst or hemangioma.A few small radiopaque gallstones. No wall thickening. No intrahepatic or extrahepatic biliary ductal dilation. The portal veins are patent. Pancreas: No mass or main ductal dilation. No peripancreatic inflammation or fluid collection. Spleen: Normal size. No mass. Adrenals/Urinary Tract: No adrenal masses. Multiple bilateral peripelvic renal cysts. No nephrolithiasis or hydronephrosis. Circumferential wall thickening of the urinary bladder with urothelial enhancement in the upstream ureters. Stomach/Bowel: The stomach is decompressed without focal abnormality. No small bowel wall thickening or inflammation. No small bowel obstruction.The appendix was not visualized. No right lower quadrant or pericecal inflammatory changes to suggest acute appendicitis. Scattered colonic diverticulosis. No changes of acute  diverticulitis. Vascular/Lymphatic: No aortic aneurysm. Diffuse aortoiliac atherosclerosis. No intraabdominal or pelvic lymphadenopathy. Reproductive: Age-related atrophy of the uterus and ovaries. No concerning adnexal mass.No free pelvic fluid. Other: No pneumoperitoneum or ascites. Musculoskeletal: No acute fracture or destructive lesion. L5-S1 degenerative disc disease. IMPRESSION: 1. Acute cystitis with an ascending urinary tract infection. No hydronephrosis or nephrolithiasis. 2. Cholecystolithiasis.  No changes of acute cholecystitis. Aortic Atherosclerosis (ICD10-I70.0). Electronically Signed   By: Rogelia Myers M.D.   On: 02/23/2024 13:19     Assessment/Plan Principal Problem:   Blood loss anemia Active Problems:   Hypertension   GI bleeding   Acute cystitis   Blood loss anemia secondary to suspected lower GI bleed: Patient reports feeling fatigued over the last 2 weeks and has been having dark stools starting 6 weeks ago when she was started on therapy for H. pylori infection.  She states she finished her course 2 weeks ago but the dark stools remain.  Suspect GI bleed to be the cause of the dark stools.  GI consulted, appreciate recommendations.  Will keep patient n.p.o.  Given recent endoscopy, unlikely source of upper GI bleeding.  Will transfuse patient 2 units PRBC and monitor hemoglobin serially.  Fortunately patient is hemodynamically stable and satting well on room air.  Patient is admitted to progressive unit. Monitor H/H.   Acute cystitis: Will start IV ceftriaxone  1 mg daily. Received 2 g in ED.  Will follow blood cultures and switch to oral once data is available.  It appears patient is not adhering to her estrogen cream and her antimuscarinic agent as she has seen urology and they recommended those interventions.  Chronic problems: Hypertension: Hold home medications given low hemoglobin GERD: Will give IV PPI.     VTE prophylaxis:  SCDs  GI prophylaxis: IV PPI Diet:  NPO Access:PIV Code Status:  Full Code Telemetry:  Admission status: Inpatient, Progressive Patient is from: Home  Anticipated d/c is to: Home  Anticipated d/c date is: 3 days    Family Communication: Family updated at bedside   Consults called: GI    Severity of Illness: The appropriate patient status for this patient is INPATIENT. Inpatient status is judged to be reasonable and necessary in order to provide the required intensity of service to ensure the patient's safety. The patient's presenting symptoms, physical exam findings, and initial radiographic and laboratory data in the context of their chronic comorbidities is felt to place them at high risk for further clinical deterioration. Furthermore, it is not anticipated that  the patient will be medically stable for discharge from the hospital within 2 midnights of admission.   * I certify that at the point of admission it is my clinical judgment that the patient will require inpatient hospital care spanning beyond 2 midnights from the point of admission due to high intensity of service, high risk for further deterioration and high frequency of surveillance required.DEWAINE Morene Bathe, MD Jolynn DEL. [facility]

## 2024-02-23 NOTE — ED Notes (Signed)
Called carelink for hospitalist consult

## 2024-02-24 ENCOUNTER — Other Ambulatory Visit (HOSPITAL_COMMUNITY): Payer: Self-pay

## 2024-02-24 DIAGNOSIS — B9681 Helicobacter pylori [H. pylori] as the cause of diseases classified elsewhere: Secondary | ICD-10-CM

## 2024-02-24 DIAGNOSIS — D500 Iron deficiency anemia secondary to blood loss (chronic): Secondary | ICD-10-CM | POA: Diagnosis not present

## 2024-02-24 DIAGNOSIS — R195 Other fecal abnormalities: Secondary | ICD-10-CM

## 2024-02-24 DIAGNOSIS — N3000 Acute cystitis without hematuria: Secondary | ICD-10-CM

## 2024-02-24 DIAGNOSIS — K2970 Gastritis, unspecified, without bleeding: Secondary | ICD-10-CM

## 2024-02-24 DIAGNOSIS — I10 Essential (primary) hypertension: Secondary | ICD-10-CM | POA: Diagnosis not present

## 2024-02-24 DIAGNOSIS — D649 Anemia, unspecified: Secondary | ICD-10-CM | POA: Diagnosis not present

## 2024-02-24 LAB — TYPE AND SCREEN
ABO/RH(D): O POS
Antibody Screen: NEGATIVE
Unit division: 0
Unit division: 0

## 2024-02-24 LAB — BPAM RBC
Blood Product Expiration Date: 202510252359
Blood Product Expiration Date: 202510252359
ISSUE DATE / TIME: 202509262038
ISSUE DATE / TIME: 202509262353
Unit Type and Rh: 5100
Unit Type and Rh: 5100

## 2024-02-24 LAB — BASIC METABOLIC PANEL WITH GFR
Anion gap: 13 (ref 5–15)
BUN: 11 mg/dL (ref 8–23)
CO2: 21 mmol/L — ABNORMAL LOW (ref 22–32)
Calcium: 8.3 mg/dL — ABNORMAL LOW (ref 8.9–10.3)
Chloride: 106 mmol/L (ref 98–111)
Creatinine, Ser: 0.88 mg/dL (ref 0.44–1.00)
GFR, Estimated: >60 mL/min (ref >60)
Glucose, Bld: 79 mg/dL (ref 70–99)
Potassium: 3.9 mmol/L (ref 3.5–5.1)
Sodium: 140 mmol/L (ref 135–145)

## 2024-02-24 LAB — HEMOGLOBIN AND HEMATOCRIT, BLOOD
HCT: 38.2 % (ref 36.0–46.0)
HCT: 41.7 % (ref 36.0–46.0)
Hemoglobin: 12.6 g/dL (ref 12.0–15.0)
Hemoglobin: 13.4 g/dL (ref 12.0–15.0)

## 2024-02-24 LAB — PROTIME-INR
INR: 1 (ref 0.8–1.2)
Prothrombin Time: 14 s (ref 11.4–15.2)

## 2024-02-24 LAB — MAGNESIUM: Magnesium: 1.7 mg/dL (ref 1.7–2.4)

## 2024-02-24 MED ORDER — ORAL CARE MOUTH RINSE: 15.0000 mL | OROMUCOSAL | Status: DC | PRN

## 2024-02-24 MED ORDER — SODIUM CHLORIDE (PF) 0.9 % IJ SOLN
INTRAMUSCULAR | Status: AC
  Filled 2024-02-24: qty 10

## 2024-02-24 MED ORDER — AMLODIPINE BESYLATE 5 MG PO TABS
5.0000 mg | ORAL_TABLET | Freq: Every day | ORAL | Status: DC
  Administered 2024-02-24: 5 mg via ORAL
  Filled 2024-02-24: qty 1

## 2024-02-24 MED ORDER — ATORVASTATIN CALCIUM 10 MG PO TABS: 10.0000 mg | ORAL_TABLET | Freq: Every day | ORAL | Status: DC

## 2024-02-24 MED ORDER — CEFADROXIL 500 MG PO CAPS
500.0000 mg | ORAL_CAPSULE | Freq: Two times a day (BID) | ORAL | 0 refills | Status: AC
  Filled 2024-02-24: qty 14, 7d supply, fill #0

## 2024-02-24 NOTE — Hospital Course (Signed)
 CC: dysuria, abd pain HPI: Brandi Moon is a 78 y.o. year old female with past medical history of HTN, HLD, remote history of uterine cancer, recurrent UTIs coming to the ED with fatigue and malaise. States she had dark stools after starting antibiotics for H. Pylori infection and has had them for 6 weeks but none since the last 2 days. She is chronically constipated with going 5 days at a time without a bowel movement. Denies other GI symptoms including n/v/d.      ED Course: Patient presented [facility] and was directly admitted to [facility] given she had a hemoglobin 4.8.  Patient was hemodynamically stable and satting well on room air.  Imaging obtained there showed acute cystitis and gallstones without cholecystitis.  Lactic acid was normal, CMP otherwise normal.  UA shows positive nitrites and leukocytes and urine cultures pending.  TRH was consulted for admission.  Significant Events: Admitted 02/23/2024 for blood loss anemia, GI bleeding   Admission Labs: UA cloudy, orange, positive nitrite, large LE, WBC >50, many bacteria Lactic acid 1.1 Na 141, K 4.1, CO2 of 25, BUN 16, Scr 0.99 T. Prot 7.0, alb 3.5, AST 17, ALT 13, alk phos 72, t. Bili 0.4 WBC 16.4, HgB 4.8, plt 504 TSH 3.065  Admission Imaging Studies: CT abd/pelvis Acute cystitis with an ascending urinary tract infection. No hydronephrosis or nephrolithiasis. 2. Cholecystolithiasis.  No changes of acute cholecystitis.  Significant Labs:   Significant Imaging Studies:   Antibiotic Therapy: Anti-infectives (From admission, onward)    Start     Dose/Rate Route Frequency Ordered Stop   02/24/24 1200  cefTRIAXone  (ROCEPHIN ) 1 g in sodium chloride  0.9 % 100 mL IVPB        1 g 200 mL/hr over 30 Minutes Intravenous Every 24 hours 02/23/24 1957     02/23/24 1230  cefTRIAXone  (ROCEPHIN ) 2 g in sodium chloride  0.9 % 100 mL IVPB        2 g 200 mL/hr over 30 Minutes Intravenous Once 02/23/24 1218  02/23/24 1315       Procedures: 02-24-2024 PRBC transfusion x 2 units  Consultants: GI

## 2024-02-24 NOTE — Care Management Obs Status (Addendum)
 MEDICARE OBSERVATION STATUS NOTIFICATION   Patient Details  Name: Brandi Moon MRN: [id number] Date of Birth: Dec 21, 1945   Medicare Observation Status Notification Given:  Yes  Daughter at bedside assisted with translation    Robynn Eileen Hoose, RN 02/24/2024, 11:45 AM

## 2024-02-24 NOTE — Assessment & Plan Note (Signed)
 02/24/24 day #2 of IV rocephin . 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 Awaiting urine cx.

## 2024-02-24 NOTE — Care Management CC44 (Signed)
 Condition Code 44 Documentation Completed  Patient Details  Name: MARISE KNAPPER MRN: [id number] Date of Birth: July 04, 1945   Condition Code 44 given:  Yes Patient signature on Condition Code 44 notice:  Yes Documentation of 2 MD's agreement:  Yes Code 44 added to claim:  Yes    Robynn Eileen Hoose, RN 02/24/2024, 11:45 AM

## 2024-02-24 NOTE — Subjective & Objective (Signed)
 Pt seen and examined. Dtr Kiomarylin at bedside. HgB increased to 12.6 g/dl after only 2 units PRBC. Admission HgB 4.8 g/dl. Pt with recent treatment for H. Pylori by Dr. Avram. Was taking pepto bismol. Having black stools since starting peptol. Constipated for 3 days now.

## 2024-02-24 NOTE — Assessment & Plan Note (Signed)
 02/24/24 hemoccult positive stools. Awaiting GI eval. Hemodynamically, she is stable to transfer to floor bed. She may not even need inpatient GI evaluation.

## 2024-02-24 NOTE — Progress Notes (Signed)
 PROGRESS NOTE    [name]  FMW:[id number] DOB: Jun 17, 1945 DOA: 02/23/2024 PCP: Dorina Loving, PA-C  Subjective: Pt seen and examined. Dtr Kiomarylin at bedside. HgB increased to 12.6 g/dl after only 2 units PRBC. Admission HgB 4.8 g/dl. Pt with recent treatment for H. Pylori by Dr. Avram. Was taking pepto bismol. Having black stools since starting peptol. Constipated for 3 days now.   Hospital Course: CC: dysuria, abd pain HPI: Brandi Moon is a 78 y.o. year old female with past medical history of HTN, HLD, remote history of uterine cancer, recurrent UTIs coming to the ED with fatigue and malaise. States she had dark stools after starting antibiotics for H. Pylori infection and has had them for 6 weeks but none since the last 2 days. She is chronically constipated with going 5 days at a time without a bowel movement. Denies other GI symptoms including n/v/d.      ED Course: Patient presented [facility] and was directly admitted to [facility] given she had a hemoglobin 4.8.  Patient was hemodynamically stable and satting well on room air.  Imaging obtained there showed acute cystitis and gallstones without cholecystitis.  Lactic acid was normal, CMP otherwise normal.  UA shows positive nitrites and leukocytes and urine cultures pending.  TRH was consulted for admission.  Significant Events: Admitted 02/23/2024 for blood loss anemia, GI bleeding   Admission Labs: UA cloudy, orange, positive nitrite, large LE, WBC >50, many bacteria Lactic acid 1.1 Na 141, K 4.1, CO2 of 25, BUN 16, Scr 0.99 T. Prot 7.0, alb 3.5, AST 17, ALT 13, alk phos 72, t. Bili 0.4 WBC 16.4, HgB 4.8, plt 504 TSH 3.065  Admission Imaging Studies: CT abd/pelvis Acute cystitis with an ascending urinary tract infection. No hydronephrosis or nephrolithiasis. 2. Cholecystolithiasis.  No changes of acute cholecystitis.  Significant Labs:   Significant Imaging  Studies:   Antibiotic Therapy: Anti-infectives (From admission, onward)    Start     Dose/Rate Route Frequency Ordered Stop   02/24/24 1200  cefTRIAXone  (ROCEPHIN ) 1 g in sodium chloride  0.9 % 100 mL IVPB        1 g 200 mL/hr over 30 Minutes Intravenous Every 24 hours 02/23/24 1957     02/23/24 1230  cefTRIAXone  (ROCEPHIN ) 2 g in sodium chloride  0.9 % 100 mL IVPB        2 g 200 mL/hr over 30 Minutes Intravenous Once 02/23/24 1218 02/23/24 1315       Procedures: 02-24-2024 PRBC transfusion x 2 units  Consultants: GI    Assessment and Plan: * Blood loss anemia 02/24/24 not entirely convinced her HgB was 4.8 g/dl. Pt received 2 units PRBC last night and her HgB jumped up to 12.6 g/dl. I have never seen HgB increase by near 8 g/dl after only 2 units of PRBC. Maybe in pediatrics/neonates but never in adult medicine. I suspect that her HgB was really not 4.8 yesterday but somewhere closer to 9-10 g/dl. Pt is of normal size(75.8 kg) and height(5 feet 4 inches). Pt was treated with pepto-bismol in addition to abx for her H. Pylori infection. Her Pepto treatment could explain her dark stools. Dtr states pt has been constipated for 3 days. No melena recently.  Unfortunately, iron studies not performed yesterday before she had her transfusion of PRBC.   Acute cystitis 02/24/24 day #2 of IV rocephin . 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 Awaiting urine cx.   GI bleeding 02/24/24  hemoccult positive stools. Awaiting GI eval. Hemodynamically, she is stable to transfer to floor bed. She may not even need inpatient GI evaluation.   Essential hypertension 02/24/24 continue norvasc  5 mg daily.  Helicobacter pylori gastritis 02/24/24 recent treatment by Dr. Avram for H.pylori gastritis with 1) PPI bid 2) Pepto Bismol 2 tabs (262 mg each) 4 times a day x 14 d 3) Metronidazole  250 mg 4 times a day x 14 d 4) doxycycline  100 mg 2 times a day x 14 d   DVT prophylaxis: SCDs Start:  02/23/24 1924    Code Status: Full Code Family Communication: discussed with pt's dtr at bedside Disposition Plan: return home Reason for continuing need for hospitalization: awaiting GI evaluation.  Objective: Vitals:   02/24/24 0242 02/24/24 0300 02/24/24 0400 02/24/24 0816  BP: (!) 152/65 (!) 142/60 (!) 144/69 (!) 156/70  Pulse: 68 67 73 73  Resp: (!) 29 (!) 27 (!) 22 17  Temp: 98.1 F (36.7 C)   98.4 F (36.9 C)  TempSrc: Axillary   Oral  SpO2: 94% 94% 95% 93%  Weight:      Height:        Intake/Output Summary (Last 24 hours) at 02/24/2024 1020 Last data filed at 02/24/2024 0242 Gross per 24 hour  Intake 1008 ml  Output 0 ml  Net 1008 ml   Filed Weights   02/23/24 1021  Weight: 75.8 kg    Examination:  Physical Exam Vitals and nursing note reviewed.  HENT:     Head: Normocephalic and atraumatic.  Eyes:     General: No scleral icterus. Cardiovascular:     Rate and Rhythm: Normal rate and regular rhythm.  Pulmonary:     Effort: Pulmonary effort is normal.     Breath sounds: Normal breath sounds.  Abdominal:     General: Abdomen is flat. Bowel sounds are normal. There is no distension.     Palpations: Abdomen is soft.     Tenderness: There is no abdominal tenderness.  Musculoskeletal:     Right lower leg: No edema.     Left lower leg: No edema.  Skin:    General: Skin is warm and dry.     Capillary Refill: Capillary refill takes less than 2 seconds.  Neurological:     Mental Status: She is alert.     Data Reviewed: I have personally reviewed following labs and imaging studies  CBC: Recent Labs  Lab 02/23/24 1145 02/24/24 0722  WBC 16.4*  --   NEUTROABS 10.8*  --   HGB 4.8* 12.6  HCT 15.2* 38.2  MCV 84.0  --   PLT 504*  --    Basic Metabolic Panel: Recent Labs  Lab 02/23/24 1058 02/24/24 0722  NA 141 140  K 4.1 3.9  CL 105 106  CO2 25 21*  GLUCOSE 97 79  BUN 16 11  CREATININE 0.99 0.88  CALCIUM  9.1 8.3*  MG  --  1.7    GFR: Estimated Creatinine Clearance: 52.5 mL/min (by C-G formula based on SCr of 0.88 mg/dL). Liver Function Tests: Recent Labs  Lab 02/23/24 1058  AST 17  ALT 13  ALKPHOS 72  BILITOT 0.4  PROT 7.0  ALBUMIN 3.5   Coagulation Profile: Recent Labs  Lab 02/24/24 0722  INR 1.0   Thyroid  Function Tests: Recent Labs    02/23/24 1936  TSH 3.065   Sepsis Labs: Recent Labs  Lab 02/23/24 1058 02/23/24 1247  LATICACIDVEN 1.1 1.4    Recent  Results (from the past 240 hours)  Culture, blood (single)     Status: None (Preliminary result)   Collection Time: 02/23/24 10:59 AM   Specimen: BLOOD RIGHT FOREARM  Result Value Ref Range Status   Specimen Description   Final    BLOOD RIGHT FOREARM Performed at [facility], [address] [place], [place] [address]    Special Requests   Final    BOTTLES DRAWN AEROBIC AND ANAEROBIC Blood Culture adequate volume Performed at [facility], [address] [place], [place] [address]    Culture   Final    NO GROWTH < 24 HOURS Performed at [facility], [address], [place], [place] [address]    Report Status PENDING  Incomplete  Culture, blood (single)     Status: None (Preliminary result)   Collection Time: 02/23/24  5:57 PM   Specimen: BLOOD  Result Value Ref Range Status   Specimen Description BLOOD SITE NOT SPECIFIED  Final   Special Requests   Final    BOTTLES DRAWN AEROBIC AND ANAEROBIC Blood Culture results may not be optimal due to an inadequate volume of blood received in culture bottles   Culture   Final    NO GROWTH < 24 HOURS Performed at [facility], [address], [place], [place] [address]    Report Status PENDING  Incomplete     Radiology Studies: CT ABDOMEN PELVIS W CONTRAST Result Date: 02/23/2024 CLINICAL DATA:  LLQ abdominal pain RLQ abdominal pain UTI, recurrent/complicated (Female) diffuse lower abdominal pain EXAM: CT ABDOMEN AND PELVIS WITH CONTRAST TECHNIQUE:  Multidetector CT imaging of the abdomen and pelvis was performed using the standard protocol following bolus administration of intravenous contrast. RADIATION DOSE REDUCTION: This exam was performed according to the departmental dose-optimization program which includes automated exposure control, adjustment of the mA and/or kV according to patient size and/or use of iterative reconstruction technique. CONTRAST:  80mL OMNIPAQUE  IOHEXOL  300 MG/ML  SOLN COMPARISON:  12/04/2022 FINDINGS: Lower chest: No focal airspace consolidation or pleural effusion. Hepatobiliary: Subcentimeter hypodensity in the posterior right hepatic lobe, unchanged, likely a small cyst or hemangioma.A few small radiopaque gallstones. No wall thickening. No intrahepatic or extrahepatic biliary ductal dilation. The portal veins are patent. Pancreas: No mass or main ductal dilation. No peripancreatic inflammation or fluid collection. Spleen: Normal size. No mass. Adrenals/Urinary Tract: No adrenal masses. Multiple bilateral peripelvic renal cysts. No nephrolithiasis or hydronephrosis. Circumferential wall thickening of the urinary bladder with urothelial enhancement in the upstream ureters. Stomach/Bowel: The stomach is decompressed without focal abnormality. No small bowel wall thickening or inflammation. No small bowel obstruction.The appendix was not visualized. No right lower quadrant or pericecal inflammatory changes to suggest acute appendicitis. Scattered colonic diverticulosis. No changes of acute diverticulitis. Vascular/Lymphatic: No aortic aneurysm. Diffuse aortoiliac atherosclerosis. No intraabdominal or pelvic lymphadenopathy. Reproductive: Age-related atrophy of the uterus and ovaries. No concerning adnexal mass.No free pelvic fluid. Other: No pneumoperitoneum or ascites. Musculoskeletal: No acute fracture or destructive lesion. L5-S1 degenerative disc disease. IMPRESSION: 1. Acute cystitis with an ascending urinary tract infection. No  hydronephrosis or nephrolithiasis. 2. Cholecystolithiasis.  No changes of acute cholecystitis. Aortic Atherosclerosis (ICD10-I70.0). Electronically Signed   By: Rogelia Myers M.D.   On: 02/23/2024 13:19    Scheduled Meds:  sodium chloride    Intravenous Once   atorvastatin   20 mg Oral Daily   pantoprazole  (PROTONIX ) IV  40 mg Intravenous Daily   sodium chloride  (PF)  Continuous Infusions:  cefTRIAXone  (ROCEPHIN )  IV       LOS: 1 day   Time spent: 55 minutes  Camellia Door, DO  [facility]  02/24/2024, 10:20 AM

## 2024-02-24 NOTE — Progress Notes (Signed)
   02/24/24 0242  Assess: MEWS Score  Temp 98.1 F (36.7 C)  BP (!) 152/65  Pulse Rate 68  ECG Heart Rate 68  Resp (!) 29  SpO2 94 %  O2 Device Room Air  Assess: MEWS Score  MEWS Temp 0  MEWS Systolic 0  MEWS Pulse 0  MEWS RR 2  MEWS LOC 0  MEWS Score 2  MEWS Score Color Yellow  Assess: if the MEWS score is Yellow or Red  Were vital signs accurate and taken at a resting state? Yes  Does the patient meet 2 or more of the SIRS criteria? Yes  Does the patient have a confirmed or suspected source of infection? Yes  MEWS guidelines implemented  Yes, yellow  Treat  MEWS Interventions Considered administering scheduled or prn medications/treatments as ordered  Take Vital Signs  Increase Vital Sign Frequency  Yellow: Q2hr x1, continue Q4hrs until patient remains green for 12hrs  Escalate  MEWS: Escalate Yellow: Discuss with charge nurse and consider notifying provider and/or RRT  Notify: Charge Nurse/RN  Name of Charge Nurse/RN Notified Shiwangi, RN  Assess: SIRS CRITERIA  SIRS Temperature  0  SIRS Respirations  1  SIRS Pulse 0  SIRS WBC 0  SIRS Score Sum  1    Patient just finished getting second unit of RBCs. Resting comfortably. RR is notably up from prior, resulting in MEWs change to yellow. Patient admitted today from ED where sepsis protocol carried out and imaging showed cystitis. Patient has pain with urination, continuous bladder leakage of small amount, yellow and cloudy urine. Lungs clear. Patient's daughter does report she suspects patient has sleep apnea.

## 2024-02-24 NOTE — Assessment & Plan Note (Signed)
 02/24/24 continue norvasc  5 mg daily.

## 2024-02-24 NOTE — Assessment & Plan Note (Signed)
 02/24/24 recent treatment by Dr. Avram for H.pylori gastritis with 1) PPI bid 2) Pepto Bismol 2 tabs (262 mg each) 4 times a day x 14 d 3) Metronidazole  250 mg 4 times a day x 14 d 4) doxycycline  100 mg 2 times a day x 14 d

## 2024-02-24 NOTE — Consult Note (Signed)
 [facility] Consult Note   History Brandi Moon MRN # [id number]  Date of Admission: 02/23/2024 Date of Consultation: 02/24/2024 Referring physician: Dr. Laurence Locus, DO Primary Care Provider: Dorina Dallas RIGGERS Primary Gastroenterologist: Dr. Lupita Commander   Reason for Consultation/Chief Complaint: Anemia, heme positive stool  Subjective  HPI:  This is a 78 year old woman known to Dr. Commander from the outpatient setting.  She was admitted through an outside ED yesterday after presenting with signs and symptoms of a severe UTI/cystitis.  She has an antibiotics for that and does not have clinical signs of sepsis.  With the admission labs she was found to have an initial hemoglobin of 4.8 along with leukocytosis and elevated platelets.  She received 2 units of PRBCs and on this morning's labs her hemoglobin is 12.6  She had a recent upper endoscopy with Dr. Commander in early August, this was done for dysphagia and a distal esophageal ring was dilated.  She was also incidentally found to have a gastritis and biopsies positive for H. pylori.  This was treated with bismuth  based quadruple therapy.  This led to her having frequent black stools during the duration of that treatment, and that has now resolved.  She has not experienced bright red blood per rectum, but does tend toward chronic constipation with stools that are infrequent and often firm with feelings of incomplete evacuation.  Her last colonoscopy was decades ago by her best recollection.  She was seen and examined with her daughter-in-law at the bedside who also provided translation.  ROS:  Constitutional: No fever or weight loss  Urinary:   Suprapubic pain that was escalating yesterday leading to ED visit.    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 Pylori stool antigen - dx H. Pylori gastritis        Hyperlipidemia    Hypertension    Leg cramping    History of UTIs outlined in the admission history and physical  Past Surgical History Past Surgical History:  Procedure Laterality Date   ABDOMINAL HYSTERECTOMY     APPENDECTOMY     APPENDECTOMY  1962   histerectomy     TUBAL LIGATION  1976   She reports having had a distant surgery for ovarian cancer (no discoverable records of that in this EHR)  Family History Family History  Problem Relation Age of Onset   COPD Mother    Heart attack Father     Social History Social History   Socioeconomic History   Marital status: Widowed    Spouse name: Not on file   Number of children: 2   Years of education: Not on file   Highest education level: Not on file  Occupational History   Not on file  Tobacco Use   Smoking status: Never   Smokeless tobacco: Never  Vaping Use   Vaping status:  Never Used  Substance and Sexual Activity   Alcohol use: No   Drug use: No   Sexual activity: Not on file  Other Topics Concern   Not on file  Social History Narrative   Not on file   Social Drivers of Health   Financial Resource Strain: Not on file  Food Insecurity: Not on file  Transportation Needs: Not on file  Physical Activity: Not on file  Stress: Not on file  Social Connections: Not on file    Allergies No Known Allergies  Outpatient Meds Home medications from the H+P and/or nursing med reconciliation reviewed.  Inpatient med list reviewed  _____________________________________________________________________ Objective   Exam:  Current vital signs  Patient  Vitals for the past 8 hrs:  BP Temp Temp src Pulse Resp SpO2  02/24/24 1120 (!) 158/62 -- -- -- -- --  02/24/24 0816 (!) 156/70 98.4 F (36.9 C) Oral 73 17 93 %  02/24/24 0400 (!) 144/69 -- -- 73 (!) 22 95 %    Intake/Output Summary (Last 24 hours) at 02/24/2024 1127 Last data filed at 02/24/2024 0242 Gross per 24 hour  Intake 1008 ml  Output 0 ml  Net 1008 ml    Physical Exam:   General: this is a pleasant and well-appearing elderly female patient in no acute distress.  She is lying comfortably in bed Eyes: sclera anicteric, no redness ENT: oral mucosa moist without lesions, no cervical or supraclavicular lymphadenopathy, CV: RRR without murmur, S1/S2, no JVD, SCDs in place Resp: clear to auscultation bilaterally, normal RR and effort noted GI: soft, suprapubic tenderness, with active bowel sounds. No guarding or palpable organomegaly noted Skin; warm and dry, no rash or jaundice noted Neuro: awake, alert and oriented x 3. Normal gross motor function and fluent speech.  Labs:     Latest Ref Rng & Units 02/24/2024    7:22 AM 02/23/2024   11:45 AM 02/20/2023   10:31 AM  CBC  WBC 4.0 - 10.5 K/uL  16.4  8.9   Hemoglobin 12.0 - 15.0 g/dL 87.3  4.8  C 87.7   Hematocrit 36.0 - 46.0 % 38.2  15.2  38.1   Platelets 150 - 400 K/uL  504  276.0     C Corrected result       Latest Ref Rng & Units 02/24/2024    7:22 AM 02/23/2024   10:58 AM 10/30/2023   10:12 AM  CMP  Glucose 70 - 99 mg/dL 79  97  99   BUN 8 - 23 mg/dL 11  16  16    Creatinine 0.44 - 1.00 mg/dL 9.11  9.00  9.16   Sodium 135 - 145 mmol/L 140  141  140   Potassium 3.5 - 5.1 mmol/L 3.9  4.1  4.4   Chloride 98 - 111 mmol/L 106  105  104   CO2 22 - 32 mmol/L 21  25  30    Calcium  8.9 - 10.3 mg/dL 8.3  9.1  9.2   Total Protein 6.5 - 8.1 g/dL  7.0  6.3   Total Bilirubin 0.0 - 1.2 mg/dL  0.4  0.5   Alkaline Phos 38 - 126 U/L  72  79   AST 15 - 41 U/L  17  14   ALT 0 - 44 U/L  13  10     Recent Labs  Lab  02/24/24 0722  INR 1.0   _________________________________________________________ Radiologic studies:  CLINICAL DATA:  LLQ abdominal pain RLQ abdominal pain  UTI, recurrent/complicated (Female) diffuse lower abdominal pain   EXAM: CT ABDOMEN AND PELVIS WITH CONTRAST   TECHNIQUE: Multidetector CT imaging of the abdomen and pelvis was performed using the standard protocol following bolus administration of intravenous contrast.   RADIATION DOSE REDUCTION: This exam was performed according to the departmental dose-optimization program which includes automated exposure control, adjustment of the mA and/or kV according to patient size and/or use of iterative reconstruction technique.   CONTRAST:  80mL OMNIPAQUE  IOHEXOL  300 MG/ML  SOLN   COMPARISON:  12/04/2022   FINDINGS: Lower chest: No focal airspace consolidation or pleural effusion.   Hepatobiliary: Subcentimeter hypodensity in the posterior right hepatic lobe, unchanged, likely a small cyst or hemangioma.A few small radiopaque gallstones. No wall thickening. No intrahepatic or extrahepatic biliary ductal dilation. The portal veins are patent.   Pancreas: No mass or main ductal dilation. No peripancreatic inflammation or fluid collection.   Spleen: Normal size. No mass.   Adrenals/Urinary Tract: No adrenal masses. Multiple bilateral peripelvic renal cysts. No nephrolithiasis or hydronephrosis. Circumferential wall thickening of the urinary bladder with urothelial enhancement in the upstream ureters.   Stomach/Bowel: The stomach is decompressed without focal abnormality. No small bowel wall thickening or inflammation. No small bowel obstruction.The appendix was not visualized. No right lower quadrant or pericecal inflammatory changes to suggest acute appendicitis. Scattered colonic diverticulosis. No changes of acute diverticulitis.   Vascular/Lymphatic: No aortic aneurysm. Diffuse aortoiliac atherosclerosis. No  intraabdominal or pelvic lymphadenopathy.   Reproductive: Age-related atrophy of the uterus and ovaries. No concerning adnexal mass.No free pelvic fluid.   Other: No pneumoperitoneum or ascites.   Musculoskeletal: No acute fracture or destructive lesion. L5-S1 degenerative disc disease.   IMPRESSION: 1. Acute cystitis with an ascending urinary tract infection. No hydronephrosis or nephrolithiasis. 2. Cholecystolithiasis.  No changes of acute cholecystitis.   Aortic Atherosclerosis (ICD10-I70.0).     Electronically Signed   By: Rogelia Myers M.D.   On: 02/23/2024 13:19 ______________________________________________________ Other studies:   _______________________________________________________ Assessment & Plan  Impression:  78 year old woman admitted with bladder infection and found to have marked normocytic anemia. However, her hemoglobin normalized to 12.6 after just 2 units of PRBCs, suggesting that 1 of these lab values was spurious.  The black stools she reports were from the bismuth  based antibiotic therapy for H. pylori, treatment for which has now stopped along with the passage of black stools.  She tends toward constipation and has had no bright red blood per rectum.  No family history of colorectal cancer, decade since last colonoscopy.  She needs a repeat of today's H&H to see if it is accurate.  If so, then this patient most likely was anemic but not to the same degree the initial CBC value suggested. Documented heme positive on admission.   Plan:  All this suggests she needs a diagnostic colonoscopy for the anemia and heme positive stool, but this would be best done in the outpatient setting in the near future.  She needs recovery from the urinary infection first.  In addition, she would like to get a better quality bowel preparation once she is recovered from that and ambulatory in the outpatient setting, especially considering her underlying  constipation.  I explained all this to her and her daughter-in-law, questions were answered and they were happy with the plan.  Hospitalist I will be rounding on her to determine duration of hospitalization for the UTI.  If they deem she is well enough to go home from that perspective, then  she can certainly be discharged from a GI perspective. (Will chat message Dr. Laurence)    If she does not have a repeat hemoglobin and hematocrit prior to discharge, then she should have another sometime next week.  I will message Dr. Avram to help arrange the outpatient colonoscopy.  This consultation required a moderate degree of medical decision making due to the nature and complexity of the acute condition(s) being evaluated as well as the patient's medical comorbidities.  Victory LITTIE Brand III Office: [phone]

## 2024-02-24 NOTE — Discharge Summary (Signed)
 [facility] Discharge Summary   Patient name: Brandi Moon  Admit date:     02/23/2024  Discharge date: 02/24/2024  Attending Physician: FERNAND PROST [[id number]]  Discharge Physician: Camellia Door   PCP: Dorina Loving, PA-C  Admitted From: Home  Disposition:  Home  Recommendations for Outpatient Follow-up:  Follow up with PCP in 1-2 weeks Follow up with GI in 2 weeks Please follow up on the following pending results: urine cx  Home Health:No Equipment/Devices: None    Discharge Condition:Stable CODE STATUS:FULL Diet recommendation: Regular Fluid Restriction: None  Hospital Summary: CC: dysuria, abd pain HPI: Brandi Moon is a 78 y.o. year old female with past medical history of HTN, HLD, remote history of uterine cancer, recurrent UTIs coming to the ED with fatigue and malaise. States she had dark stools after starting antibiotics for H. Pylori infection and has had them for 6 weeks but none since the last 2 days. She is chronically constipated with going 5 days at a time without a bowel movement. Denies other GI symptoms including n/v/d.      ED Course: Patient presented [facility] and was directly admitted to [facility] given she had a hemoglobin 4.8.  Patient was hemodynamically stable and satting well on room air.  Imaging obtained there showed acute cystitis and gallstones without cholecystitis.  Lactic acid was normal, CMP otherwise normal.  UA shows positive nitrites and leukocytes and urine cultures pending.  [facility] was consulted for admission.  Significant Events: Admitted 02/23/2024 for blood loss anemia, GI bleeding   Admission Labs: UA cloudy, orange, positive nitrite, large LE, WBC >50, many bacteria Lactic acid 1.1 Na 141, K 4.1, CO2 of 25, BUN 16, Scr 0.99 T. Prot 7.0, alb 3.5, AST 17, ALT 13, alk phos 72, t. Bili 0.4 WBC 16.4, HgB 4.8, plt 504 TSH 3.065  Admission Imaging Studies: CT abd/pelvis Acute cystitis  with an ascending urinary tract infection. No hydronephrosis or nephrolithiasis. 2. Cholecystolithiasis.  No changes of acute cholecystitis.  Significant Labs:   Significant Imaging Studies:   Antibiotic Therapy: Anti-infectives (From admission, onward)    Start     Dose/Rate Route Frequency Ordered Stop   02/24/24 1200  cefTRIAXone  (ROCEPHIN ) 1 g in sodium chloride  0.9 % 100 mL IVPB        1 g 200 mL/hr over 30 Minutes Intravenous Every 24 hours 02/23/24 1957     02/23/24 1230  cefTRIAXone  (ROCEPHIN ) 2 g in sodium chloride  0.9 % 100 mL IVPB        2 g 200 mL/hr over 30 Minutes Intravenous Once 02/23/24 1218 02/23/24 1315       Procedures: 02-24-2024 PRBC transfusion x 2 units  Consultants: GI  Blood Transfusion Record     Product Unit Status Volume Start End          Transfuse RBC    [id number]  25  [id number]  [id number] Completed 02/24/24 0317 316 mL 02/24/24 0004 02/24/24 0242    [id number]  25  [id number]  [id number] Completed 02/23/24 2352 332 mL 02/23/24 2044 02/23/24 2330           Hospital Course by Problem: * Blood loss anemia 02/24/24 not entirely convinced her HgB was 4.8 g/dl. Pt received 2 units PRBC last night and her HgB jumped up to 12.6 g/dl. I have never seen HgB increase by near 8 g/dl after only 2 units of PRBC. Maybe in pediatrics/neonates but never in adult medicine. I suspect  that her HgB was really not 4.8 yesterday but somewhere closer to 9-10 g/dl. Pt is of normal size(75.8 kg) and height(5 feet 4 inches). Pt was treated with pepto-bismol in addition to abx for her H. Pylori infection. Her Pepto treatment could explain her dark stools. Dtr states pt has been constipated for 3 days. No melena recently.  Unfortunately, iron studies not performed yesterday before she had her transfusion of PRBC.  *update. Repeat HgB at 1 pm showed HgB of 13.4 g/dl. This proves that her initial HgB of 4.8 was spurious value. F/u with outpatient GI as recommended by GI consult  today.   Acute cystitis 02/24/24 day #2 of IV rocephin . 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 Awaiting urine cx.   GI bleeding 02/24/24 hemoccult positive stools. Awaiting GI eval. Hemodynamically, she is stable to transfer to floor bed. She may not even need inpatient GI evaluation.   Essential hypertension 02/24/24 continue norvasc  5 mg daily.  Helicobacter pylori gastritis 02/24/24 recent treatment by Dr. Avram for H.pylori gastritis with 1) PPI bid 2) Pepto Bismol 2 tabs (262 mg each) 4 times a day x 14 d 3) Metronidazole  250 mg 4 times a day x 14 d 4) doxycycline  100 mg 2 times a day x 14 d     Discharge Diagnoses:  Principal Problem:   Blood loss anemia Active Problems:   GI bleeding   Acute cystitis   Essential hypertension   Helicobacter pylori gastritis   Discharge Instructions  Discharge Instructions     Call MD for:  difficulty breathing, headache or visual disturbances   Complete by: As directed    Call MD for:  extreme fatigue   Complete by: As directed    Call MD for:  hives   Complete by: As directed    Call MD for:  persistant dizziness or light-headedness   Complete by: As directed    Call MD for:  persistant nausea and vomiting   Complete by: As directed    Call MD for:  redness, tenderness, or signs of infection (pain, swelling, redness, odor or green/yellow discharge around incision site)   Complete by: As directed    Call MD for:  severe uncontrolled pain   Complete by: As directed    Call MD for:  temperature >100.4   Complete by: As directed    Diet general   Complete by: As directed    Discharge instructions   Complete by: As directed    1. Follow up with your primary care provider in 1-2 weeks following discharge from hospital. 2. Follow up with Dr. Avram with GI office to have outpatient colonoscopy. Call office for appointment.   Increase activity slowly   Complete by: As directed       Allergies as of  02/24/2024   No Known Allergies      Medication List     TAKE these medications    amLODipine  5 MG tablet Commonly known as: NORVASC  TAKE 1 TABLET (5 MG TOTAL) BY MOUTH DAILY.   atorvastatin  10 MG tablet Commonly known as: LIPITOR TAKE 1 TABLET BY MOUTH EVERY DAY   cefadroxil 500 MG capsule Commonly known as: DURICEF Take 1 capsule (500 mg total) by mouth 2 (two) times daily for 7 days.   estradiol  0.1 MG/GM vaginal cream Commonly known as: ESTRACE  Apply 1 gm to vaginal area 2-3 times/week What changed:  how much to take how to take this when to take this additional instructions  omeprazole  40 MG capsule Commonly known as: PRILOSEC Take 1 capsule (40 mg total) by mouth daily before breakfast.   tobramycin  0.3 % ophthalmic solution Commonly known as: Tobrex  Place 2 drops into the left eye every 6 (six) hours.   trospium  20 MG tablet Commonly known as: SANCTURA  Take 1 tablet (20 mg total) by mouth 2 (two) times daily.        Follow-up Information     Avram Lupita BRAVO, MD. Schedule an appointment as soon as possible for a visit in 2 week(s).   Specialty: Gastroenterology Contact information: [address] [place] [address] [phone]                No Known Allergies  Discharge Exam: Vitals:   02/24/24 1120 02/24/24 1225  BP: (!) 158/62 (!) 149/71  Pulse:  73  Resp:  18  Temp:  98.1 F (36.7 C)  SpO2:  95%    Physical Exam Vitals and nursing note reviewed.  HENT:     Head: Normocephalic and atraumatic.  Eyes:     General: No scleral icterus. Cardiovascular:     Rate and Rhythm: Normal rate and regular rhythm.  Pulmonary:     Effort: Pulmonary effort is normal.     Breath sounds: Normal breath sounds.  Abdominal:     General: Abdomen is flat. Bowel sounds are normal. There is no distension.     Palpations: Abdomen is soft.     Tenderness: There is no abdominal tenderness.  Musculoskeletal:     Right lower leg: No edema.      Left lower leg: No edema.  Skin:    General: Skin is warm and dry.     Capillary Refill: Capillary refill takes less than 2 seconds.  Neurological:     Mental Status: She is alert.     The results of significant diagnostics from this hospitalization (including imaging, microbiology, ancillary and laboratory) are listed below for reference.    Microbiology: Recent Results (from the past 240 hours)  Urine Culture     Status: Abnormal (Preliminary result)   Collection Time: 02/23/24 10:25 AM   Specimen: Urine, Clean Catch  Result Value Ref Range Status   Specimen Description   Final    URINE, CLEAN CATCH Performed at [facility], [address] [place], [place] [address]    Special Requests   Final    NONE Performed at [facility], [address] [place], [place] [address]    Culture (A)  Final    30,000 COLONIES/mL GRAM NEGATIVE RODS SUSCEPTIBILITIES TO FOLLOW Performed at [facility], [address] [place], [place] [address]    Report Status PENDING  Incomplete  Culture, blood (single)     Status: None (Preliminary result)   Collection Time: 02/23/24 10:59 AM   Specimen: BLOOD RIGHT FOREARM  Result Value Ref Range Status   Specimen Description   Final    BLOOD RIGHT FOREARM Performed at [facility], [address], [place], [place] [address]    Special Requests   Final    BOTTLES DRAWN AEROBIC AND ANAEROBIC Blood Culture adequate volume Performed at [facility], [address] [place], [place] [address]    Culture   Final    NO GROWTH < 24 HOURS Performed at [facility], [address] [place], [place] [address]    Report Status PENDING  Incomplete  Culture,  blood (single)     Status: None (Preliminary result)   Collection Time: 02/23/24  5:57 PM   Specimen: BLOOD  Result Value Ref Range Status   Specimen Description BLOOD SITE NOT SPECIFIED  Final   Special Requests   Final    BOTTLES DRAWN  AEROBIC AND ANAEROBIC Blood Culture results may not be optimal due to an inadequate volume of blood received in culture bottles   Culture   Final    NO GROWTH < 24 HOURS Performed at [facility], [address], [place], [place] [address]    Report Status PENDING  Incomplete     Labs:  Basic Metabolic Panel: Recent Labs  Lab 02/23/24 1058 02/24/24 0722  NA 141 140  K 4.1 3.9  CL 105 106  CO2 25 21*  GLUCOSE 97 79  BUN 16 11  CREATININE 0.99 0.88  CALCIUM  9.1 8.3*  MG  --  1.7   Liver Function Tests: Recent Labs  Lab 02/23/24 1058  AST 17  ALT 13  ALKPHOS 72  BILITOT 0.4  PROT 7.0  ALBUMIN 3.5   CBC: Recent Labs  Lab 02/23/24 1145 02/24/24 0722 02/24/24 1339  WBC 16.4*  --   --   NEUTROABS 10.8*  --   --   HGB 4.8* 12.6 13.4  HCT 15.2* 38.2 41.7  MCV 84.0  --   --   PLT 504*  --   --    Thyroid  function studies Recent Labs    02/23/24 1936  TSH 3.065   Urinalysis    Component Value Date/Time   COLORURINE ORANGE (A) 02/23/2024 1025   APPEARANCEUR CLOUDY (A) 02/23/2024 1025   APPEARANCEUR Clear 02/09/2024 0000   LABSPEC 1.010 02/23/2024 1025   PHURINE 5.5 02/23/2024 1025   GLUCOSEU 100 (A) 02/23/2024 1025   HGBUR MODERATE (A) 02/23/2024 1025   BILIRUBINUR NEGATIVE 02/23/2024 1025   BILIRUBINUR Negative 02/09/2024 0000   KETONESUR NEGATIVE 02/23/2024 1025   PROTEINUR 100 (A) 02/23/2024 1025   UROBILINOGEN 0.2 11/12/2022 1032   NITRITE POSITIVE (A) 02/23/2024 1025   LEUKOCYTESUR LARGE (A) 02/23/2024 1025   Sepsis Labs Recent Labs  Lab 02/23/24 1145  WBC 16.4*   Procedures/Studies: CT ABDOMEN PELVIS W CONTRAST Result Date: 02/23/2024 CLINICAL DATA:  LLQ abdominal pain RLQ abdominal pain UTI, recurrent/complicated (Female) diffuse lower abdominal pain EXAM: CT ABDOMEN AND PELVIS WITH CONTRAST TECHNIQUE: Multidetector CT imaging of the abdomen and pelvis was performed using the standard protocol following bolus administration of  intravenous contrast. RADIATION DOSE REDUCTION: This exam was performed according to the departmental dose-optimization program which includes automated exposure control, adjustment of the mA and/or kV according to patient size and/or use of iterative reconstruction technique. CONTRAST:  80mL OMNIPAQUE  IOHEXOL  300 MG/ML  SOLN COMPARISON:  12/04/2022 FINDINGS: Lower chest: No focal airspace consolidation or pleural effusion. Hepatobiliary: Subcentimeter hypodensity in the posterior right hepatic lobe, unchanged, likely a small cyst or hemangioma.A few small radiopaque gallstones. No wall thickening. No intrahepatic or extrahepatic biliary ductal dilation. The portal veins are patent. Pancreas: No mass or main ductal dilation. No peripancreatic inflammation or fluid collection. Spleen: Normal size. No mass. Adrenals/Urinary Tract: No adrenal masses. Multiple bilateral peripelvic renal cysts. No nephrolithiasis or hydronephrosis. Circumferential wall thickening of the urinary bladder with urothelial enhancement in the upstream ureters. Stomach/Bowel: The stomach is decompressed without focal abnormality. No small bowel wall thickening or inflammation. No small bowel obstruction.The appendix was not visualized. No right lower quadrant or pericecal  inflammatory changes to suggest acute appendicitis. Scattered colonic diverticulosis. No changes of acute diverticulitis. Vascular/Lymphatic: No aortic aneurysm. Diffuse aortoiliac atherosclerosis. No intraabdominal or pelvic lymphadenopathy. Reproductive: Age-related atrophy of the uterus and ovaries. No concerning adnexal mass.No free pelvic fluid. Other: No pneumoperitoneum or ascites. Musculoskeletal: No acute fracture or destructive lesion. L5-S1 degenerative disc disease. IMPRESSION: 1. Acute cystitis with an ascending urinary tract infection. No hydronephrosis or nephrolithiasis. 2. Cholecystolithiasis.  No changes of acute cholecystitis. Aortic Atherosclerosis  (ICD10-I70.0). Electronically Signed   By: Rogelia Myers M.D.   On: 02/23/2024 13:19   DG Shoulder Right Result Date: 01/30/2024 EXAM: 1 VIEW XRAY OF THE RIGHT SHOULDER 01/30/2024 02:19:16 PM COMPARISON: 02/14/2014 CLINICAL HISTORY: Rt shoulder and humerus pain. 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 No old or recent injury known. FINDINGS: BONES AND JOINTS: Glenohumeral joint is normally aligned. No acute fracture or dislocation. The [facility] joint is unremarkable in appearance. SOFT TISSUES: No abnormal calcifications. Visualized lung is unremarkable. IMPRESSION: 1. No acute findings. Electronically signed by: Dayne Hassell MD 01/30/2024 03:17 PM EDT RP Workstation: [id number]    Time coordinating discharge: 60 mins  SIGNED:  Camellia Door, DO [facility] 02/24/24, 2:21 PM

## 2024-02-24 NOTE — Assessment & Plan Note (Addendum)
 02/24/24 not entirely convinced her HgB was 4.8 g/dl. Pt received 2 units PRBC last night and her HgB jumped up to 12.6 g/dl. I have never seen HgB increase by near 8 g/dl after only 2 units of PRBC. Maybe in pediatrics/neonates but never in adult medicine. I suspect that her HgB was really not 4.8 yesterday but somewhere closer to 9-10 g/dl. Pt is of normal size(75.8 kg) and height(5 feet 4 inches). Pt was treated with pepto-bismol in addition to abx for her H. Pylori infection. Her Pepto treatment could explain her dark stools. Dtr states pt has been constipated for 3 days. No melena recently.  Unfortunately, iron studies not performed yesterday before she had her transfusion of PRBC.  *update. Repeat HgB at 1 pm showed HgB of 13.4 g/dl. This proves that her initial HgB of 4.8 was spurious value. F/u with outpatient GI as recommended by GI consult today.

## 2024-02-24 NOTE — Progress Notes (Addendum)
 Patient ambulated with a walker in the hall with the tech.

## 2024-02-24 NOTE — Plan of Care (Signed)
  Problem: Education: Goal: Knowledge of General Education information will improve Description: Including pain rating scale, medication(s)/side effects and non-pharmacologic comfort measures Outcome: Progressing   Problem: Activity: Goal: Risk for activity intolerance will decrease Outcome: Progressing   Problem: Elimination: Goal: Will not experience complications related to urinary retention Outcome: Progressing   Problem: Pain Managment: Goal: General experience of comfort will improve and/or be controlled Outcome: Progressing

## 2024-02-25 LAB — URINE CULTURE: Culture: 30000 — AB

## 2024-02-26 ENCOUNTER — Ambulatory Visit: Payer: Self-pay | Admitting: Medical

## 2024-02-28 ENCOUNTER — Other Ambulatory Visit (HOSPITAL_BASED_OUTPATIENT_CLINIC_OR_DEPARTMENT_OTHER): Payer: Self-pay

## 2024-02-28 LAB — CULTURE, BLOOD (SINGLE)
Culture: NO GROWTH
Culture: NO GROWTH
Special Requests: ADEQUATE

## 2024-02-28 MED ORDER — NITROFURANTOIN MONOHYD MACRO 100 MG PO CAPS
100.0000 mg | ORAL_CAPSULE | Freq: Two times a day (BID) | ORAL | 0 refills | Status: DC
  Filled 2024-02-28: qty 14, 7d supply, fill #0

## 2024-02-28 NOTE — Addendum Note (Signed)
 Addended by: DORINA DALLAS HERO on: 02/28/2024 08:30 AM   Modules accepted: Orders

## 2024-02-29 ENCOUNTER — Ambulatory Visit: Admitting: Urology

## 2024-02-29 ENCOUNTER — Other Ambulatory Visit (HOSPITAL_BASED_OUTPATIENT_CLINIC_OR_DEPARTMENT_OTHER): Payer: Self-pay

## 2024-03-13 ENCOUNTER — Other Ambulatory Visit: Payer: Self-pay

## 2024-03-13 DIAGNOSIS — B9681 Helicobacter pylori [H. pylori] as the cause of diseases classified elsewhere: Secondary | ICD-10-CM

## 2024-03-14 NOTE — Telephone Encounter (Signed)
Patients daughter returned call, requesting a callback.    Please advise.

## 2024-03-14 NOTE — Telephone Encounter (Signed)
 Attempted to reach pt's daughter to discuss picking up H Pylori testing kit. No answer, left vm.

## 2024-03-14 NOTE — Telephone Encounter (Signed)
 Returned call to pt's daughter. Discussed coming in to pick up Diatherix swab. She will come in tomorrow to pick it up. While speaking with pt's daughter, she inquired about the pt having a colonoscopy due to recent hospitalization on 02/23/24 with hgb 4.8. Daughter states the pt was transfused with 2 units PRBCs and her hgb came back to [facility], but she is concerned that this was a false reading, due to the recent transfusion. Discharge instructions from hospital did recommend 2 week f/u with GI. Appt scheduled with Dr. Avram for 03/27/2024 at 1010.

## 2024-03-22 ENCOUNTER — Encounter: Payer: Self-pay | Admitting: Urology

## 2024-03-22 ENCOUNTER — Ambulatory Visit: Admitting: Urology

## 2024-03-22 VITALS — BP 153/79 | HR 80

## 2024-03-22 DIAGNOSIS — N390 Urinary tract infection, site not specified: Secondary | ICD-10-CM

## 2024-03-22 DIAGNOSIS — Z87440 Personal history of urinary (tract) infections: Secondary | ICD-10-CM

## 2024-03-22 DIAGNOSIS — R3129 Other microscopic hematuria: Secondary | ICD-10-CM

## 2024-03-22 DIAGNOSIS — N3281 Overactive bladder: Secondary | ICD-10-CM

## 2024-03-22 LAB — URINALYSIS, ROUTINE W REFLEX MICROSCOPIC
Bilirubin, UA: NEGATIVE
Glucose, UA: NEGATIVE
Ketones, UA: NEGATIVE
Leukocytes,UA: NEGATIVE
Nitrite, UA: NEGATIVE
Protein,UA: NEGATIVE
Specific Gravity, UA: 1.015 (ref 1.005–1.030)
Urobilinogen, Ur: 0.2 mg/dL (ref 0.2–1.0)
pH, UA: 7 (ref 5.0–7.5)

## 2024-03-22 LAB — MICROSCOPIC EXAMINATION: Bacteria, UA: NONE SEEN

## 2024-03-22 MED ORDER — GEMTESA 75 MG PO TABS: 75.0000 mg | ORAL_TABLET | Freq: Every day | ORAL | 0 refills | Status: DC

## 2024-03-22 NOTE — Progress Notes (Signed)
 Assessment: 1. Recurrent UTI   2. OAB (overactive bladder)   3. Microscopic hematuria - negative urologic evaluation; likley nephrologic etiology     Plan: Trial of Gemtesa 75 mg daily.  Samples given. Advised patient to call with results of Gemtesa trial.  Given her prior side effects with Vesicare  and lack of response to trospium , we can try to get insurance approval for this medication. Continue UTI prevention with vaginal estrogen cream and probiotic. Return to office in 2 months  Chief Complaint: Chief Complaint  Patient presents with   Recurrent UTI    HPI: Brandi Moon is a 78 y.o. female who presents for continued evaluation of recurrent UTI and OAB. She was previously followed by Dr. Shona and was last seen in February 2025.  She has a history of recurrent UTIs and was initially seen in June 2024.  She was started on daily antibiotic prophylaxis with single strength Bactrim  and vaginal hormone cream at that time. She continued on vaginal hormone cream and a probiotic.  She has not had any breakthrough UTIs.  She underwent evaluation for microscopic hematuria in July 2024. CT showed no renal or ureteral calculi, no renal masses and no evidence of obstruction or filling defect.  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 Cystoscopy was not performed.  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 Hematuria profile from 10/24 was negative for malignant cells or dysplasia.  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 Cystoscopy from 10/24 showed a normal urethra and bladder without any mucosal lesions. She was referred to nephrology but had not followed up with the referral at the time of her visit in February 2025. She was seen by nephrology on 11/15/2023.  She has a history  of overactive bladder symptoms with associated incontinence.  She was restarted on Vesicare  10 mg daily in February 2025.  At her visit in June 2025, she continued to have symptoms of frequency, urgency, and incontinence.  She was also having problems with constipation since she started the Vesicare .  No dysuria or gross hematuria.  No recent UTIs. The Vesicare  was discontinued and she was started on Myrbetriq  50 mg daily. She was unable to take the medication due to insurance coverage.  At her visit in September 2025, she continued to have frequency, urgency, urge incontinence.  She reported some dysuria which had resolved.  No gross hematuria or flank pain. She was given a trial of trospium  20 mg twice daily.  She was admitted to the hospital on 02/23/2024 with fatigue and malaise.  She was found to have a hemoglobin of 4.8.  Urine culture grew 30K E. coli.  Treated with Macrobid.   CT imaging consistent with acute cystitis with ascending UTI, no hydronephrosis or nephrolithiasis.  She returns today for follow-up.  She has completed the Macrobid.  No dysuria or gross hematuria.  She continues on trospium  20 mg daily.  She does not feel like this is controlling her symptoms well.  Interpreter was used during visit.  Portions of the above documentation were copied from a prior visit for review purposes only.  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 Pylori stool antigen - dx H. Pylori gastritis        Hyperlipidemia    Hypertension    Leg cramping     PSH: Past Surgical  History:  Procedure Laterality Date   ABDOMINAL HYSTERECTOMY     APPENDECTOMY     APPENDECTOMY  1962   histerectomy     TUBAL LIGATION  1976    SH: Social History   Tobacco Use   Smoking status: Never   Smokeless tobacco: Never  Vaping Use   Vaping status: Never Used  Substance Use Topics   Alcohol use: No   Drug use: No    ROS: Constitutional:  Negative for fever, chills, weight loss CV: Negative for chest pain, previous MI, hypertension Respiratory:  Negative for shortness of breath, wheezing, sleep apnea, frequent cough GI:  Negative for nausea, vomiting, bloody stool, GERD  PE: BP (!) 153/79   Pulse 80  GENERAL APPEARANCE:  Well appearing, well developed, well nourished, NAD HEENT:  Atraumatic, normocephalic, oropharynx clear NECK:  Supple without lymphadenopathy or thyromegaly ABDOMEN:  Soft, non-tender, no masses EXTREMITIES:  Moves all extremities well, without clubbing, cyanosis, or edema NEUROLOGIC:  Alert and oriented x 3, normal gait, CN II-XII grossly intact MENTAL STATUS:  appropriate BACK:  Non-tender to palpation, No CVAT SKIN:  Warm, dry, and intact  Results: U/A: 0-5 WBCs, 11-30 RBCs

## 2024-03-27 ENCOUNTER — Encounter: Payer: Self-pay | Admitting: Internal Medicine

## 2024-03-27 ENCOUNTER — Ambulatory Visit: Admitting: Internal Medicine

## 2024-03-27 ENCOUNTER — Other Ambulatory Visit (INDEPENDENT_AMBULATORY_CARE_PROVIDER_SITE_OTHER)

## 2024-03-27 ENCOUNTER — Other Ambulatory Visit (HOSPITAL_BASED_OUTPATIENT_CLINIC_OR_DEPARTMENT_OTHER): Payer: Self-pay

## 2024-03-27 VITALS — BP 136/78 | HR 76 | Ht 64.0 in | Wt 169.0 lb

## 2024-03-27 DIAGNOSIS — K2970 Gastritis, unspecified, without bleeding: Secondary | ICD-10-CM | POA: Diagnosis not present

## 2024-03-27 DIAGNOSIS — R195 Other fecal abnormalities: Secondary | ICD-10-CM

## 2024-03-27 DIAGNOSIS — B9681 Helicobacter pylori [H. pylori] as the cause of diseases classified elsewhere: Secondary | ICD-10-CM

## 2024-03-27 DIAGNOSIS — D649 Anemia, unspecified: Secondary | ICD-10-CM | POA: Diagnosis not present

## 2024-03-27 LAB — CBC WITH DIFFERENTIAL/PLATELET
Basophils Absolute: 0.3 K/uL — ABNORMAL HIGH (ref 0.0–0.1)
Basophils Relative: 3 % (ref 0.0–3.0)
Eosinophils Absolute: 0.2 K/uL (ref 0.0–0.7)
Eosinophils Relative: 2.2 % (ref 0.0–5.0)
HCT: 40.9 % (ref 36.0–46.0)
Hemoglobin: 13.3 g/dL (ref 12.0–15.0)
Lymphocytes Relative: 26.1 % (ref 12.0–46.0)
Lymphs Abs: 2.4 K/uL (ref 0.7–4.0)
MCHC: 32.6 g/dL (ref 30.0–36.0)
MCV: 83.5 fl (ref 78.0–100.0)
Monocytes Absolute: 0.6 K/uL (ref 0.1–1.0)
Monocytes Relative: 6.1 % (ref 3.0–12.0)
Neutro Abs: 5.7 K/uL (ref 1.4–7.7)
Neutrophils Relative %: 62.6 % (ref 43.0–77.0)
Platelets: 195 K/uL (ref 150.0–400.0)
RBC: 4.89 Mil/uL (ref 3.87–5.11)
RDW: 16.4 % — ABNORMAL HIGH (ref 11.5–15.5)
WBC: 9.2 K/uL (ref 4.0–10.5)

## 2024-03-27 LAB — VITAMIN B12: Vitamin B-12: 666 pg/mL (ref 211–911)

## 2024-03-27 MED ORDER — NA SULFATE-K SULFATE-MG SULF 17.5-3.13-1.6 GM/177ML PO SOLN
1.0000 | Freq: Once | ORAL | 0 refills | Status: AC
  Filled 2024-03-27 – 2024-04-16 (×2): qty 354, 1d supply, fill #0

## 2024-03-27 NOTE — Patient Instructions (Signed)
 Your provider has requested that you go to the basement level for lab work before leaving today. Press B on the elevator. The lab is located at the first door on the left as you exit the elevator.  Due to recent changes in healthcare laws, you may see the results of your imaging and laboratory studies on MyChart before your provider has had a chance to review them.  We understand that in some cases there may be results that are confusing or concerning to you. Not all laboratory results come back in the same time frame and the provider may be waiting for multiple results in order to interpret others.  Please give us  48 hours in order for your provider to thoroughly review all the results before contacting the office for clarification of your results.   You have been scheduled for a colonoscopy. Please follow written instructions given to you at your visit today.   If you use inhalers (even only as needed), please bring them with you on the day of your procedure.  DO NOT TAKE 7 DAYS PRIOR TO TEST- Trulicity (dulaglutide) Ozempic, Wegovy (semaglutide) Mounjaro (tirzepatide) Bydureon Bcise (exanatide extended release)  DO NOT TAKE 1 DAY PRIOR TO YOUR TEST Rybelsus (semaglutide) Adlyxin (lixisenatide) Victoza (liraglutide) Byetta (exanatide) ___________________________________________________________________________    Your provider has ordered Diatherix stool testing for you. You have received a kit from our office today containing all necessary supplies to complete this test. Please carefully read the stool collection instructions provided in the kit before opening the accompanying materials. In addition, be sure there is a label providing your full name and date of birth on the puritan opti-swab tube that is supplied in the kit (if you do not see a label with this information on your test tube, please make us  aware before test collection!). After completing the test, you should secure the  purtian tube into the specimen biohazard bag. The [facility] E-Req sheet (including date and time of specimen collection) should be placed into the outside pocket of the specimen biohazard bag and returned to the [place] lab (basement floor of [facility]) within 3 days of collection. Please make sure to give the specimen to a staff member at the lab. DO NOT leave the specimen on the counter.   If the specimen date and time (can be found in the upper right boxed portion of the sheet) are not filled out on the E-Req sheet, the test will NOT be performed.    I appreciate the opportunity to care for you. Lupita Commander, MD, [facility]

## 2024-03-27 NOTE — Progress Notes (Signed)
 Brandi Moon 78 y.o. [birth year] [id number]  Assessment & Plan:   Encounter Diagnoses  Name Primary?   Heme + stool Yes   Normocytic anemia    Helicobacter pylori gastritis    Schedule colonoscopy to evaluate heme positive stool and anemia.  Evaluate iron plus B12 levels follow-up CBC also.  Regarding prior H. pylori gastritis, the specimen was submitted to check stool antigen but was not run due to lack of a date on the specimen so this needs to be resubmitted.  The risks and benefits as well as alternatives of endoscopic procedure(s) have been discussed and reviewed. All questions answered. The patient agrees to proceed.   Lab Results  Component Value Date   WBC 9.2 03/27/2024   HGB 13.3 03/27/2024   HCT 40.9 03/27/2024   MCV 83.5 03/27/2024   PLT 195.0 03/27/2024   Lab Results  Component Value Date   VITAMINB12 666 03/27/2024   Iron TIBC and ferritin are pending  CC: Saguier, [place], PA-C     Subjective:   Chief Complaint: Heme positive stool and anemia  HPI 78 year old woman with a past medical history of hypertension, dyslipidemia, uterine cancer and recurrent UTIs who was admitted to the hospital with a UTI on 02/23/2024 and had a profound anemia with hemoglobin 4.8 but hemoglobin normalized as you can see below after 2 units of PRBCs.  She was Hemoccult positive.  She had been having black stools but was on Pepto-Bismol as part of an H. pylori treatment regimen, as she had had EGD with H. pylori gastritis and dilation of a distal esophageal ring for dysphagia at upper endoscopy 01/02/2024.  She has completed treatment of H. pylori and has submitted stool specimen for H. pylori antigen earlier this week.  She is here today for follow-up of the heme positive stool and anemia.  Her last colonoscopy was years ago.  She is well w/o dysphagia, rectal bleeding, altered bowel habits or abdominal pain.  Her RN daughter  [facility] ED) is with her and  interprets      Latest Ref Rng & Units 03/27/2024   10:28 AM 02/24/2024    1:39 PM 02/24/2024    7:22 AM  CBC  WBC 4.0 - 10.5 K/uL 9.2     Hemoglobin 12.0 - 15.0 g/dL 86.6  86.5  87.3   Hematocrit 36.0 - 46.0 % 40.9  41.7  38.2   Platelets 150.0 - 400.0 K/uL 195.0        Current Meds  Medication Sig   amLODipine  (NORVASC ) 5 MG tablet TAKE 1 TABLET (5 MG TOTAL) BY MOUTH DAILY.   atorvastatin  (LIPITOR) 10 MG tablet TAKE 1 TABLET BY MOUTH EVERY DAY   estradiol  (ESTRACE ) 0.1 MG/GM vaginal cream Apply 1 gm to vaginal area 2-3 times/week (Patient taking differently: Place 1 Applicatorful vaginally daily.)   Na Sulfate-K Sulfate-Mg Sulfate concentrate (SUPREP) 17.5-3.13-1.6 GM/177ML SOLN Take 1 kit (354 mLs total) by mouth once for 1 dose.   nitrofurantoin, macrocrystal-monohydrate, (MACROBID) 100 MG capsule Take 1 capsule (100 mg total) by mouth 2 (two) times daily.   omeprazole  (PRILOSEC) 40 MG capsule Take 1 capsule (40 mg total) by mouth daily before breakfast.   tobramycin  (TOBREX ) 0.3 % ophthalmic solution Place 2 drops into the left eye every 6 (six) hours.   Vibegron (GEMTESA) 75 MG TABS Take 1 tablet (75 mg total) by mouth daily.   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 Pylori stool antigen - dx H. Pylori gastritis        Hyperlipidemia    Hypertension    Leg cramping    Past Surgical History:  Procedure Laterality Date   ABDOMINAL HYSTERECTOMY     APPENDECTOMY     APPENDECTOMY  1962   histerectomy     TUBAL LIGATION  1976   Social History   Social History Narrative   Patient is widowed she  has 2 daughters both of whom are nurses   Spanish speaker   No alcohol tobacco or drug use   family history includes COPD in her mother; Heart attack in her father.   Review of Systems As above  Objective:   Physical Exam @BP  136/78   Pulse 76   Ht 5' 4 (1.626 m)   Wt 169 lb (76.7 kg)   BMI 29.01 kg/m @  General:  NAD Eyes:   anicteric Lungs:  clear Heart::  S1S2 no rubs, murmurs or gallops Abdomen:  soft and nontender, BS+ Ext:   no edema, cyanosis or clubbing    Data Reviewed:  See HPI

## 2024-03-28 ENCOUNTER — Ambulatory Visit: Payer: Self-pay | Admitting: Internal Medicine

## 2024-03-28 LAB — IRON,TIBC AND FERRITIN PANEL
%SAT: 47 % — ABNORMAL HIGH (ref 16–45)
Ferritin: 163 ng/mL (ref 16–288)
Iron: 108 ug/dL (ref 45–160)
TIBC: 232 ug/dL — ABNORMAL LOW (ref 250–450)

## 2024-04-03 ENCOUNTER — Encounter: Payer: Self-pay | Admitting: Internal Medicine

## 2024-04-08 ENCOUNTER — Other Ambulatory Visit (HOSPITAL_BASED_OUTPATIENT_CLINIC_OR_DEPARTMENT_OTHER): Payer: Self-pay

## 2024-04-11 ENCOUNTER — Encounter: Payer: Self-pay | Admitting: Internal Medicine

## 2024-04-16 ENCOUNTER — Other Ambulatory Visit (HOSPITAL_BASED_OUTPATIENT_CLINIC_OR_DEPARTMENT_OTHER): Payer: Self-pay

## 2024-04-18 ENCOUNTER — Encounter: Payer: Self-pay | Admitting: Internal Medicine

## 2024-04-18 ENCOUNTER — Ambulatory Visit (AMBULATORY_SURGERY_CENTER): Admitting: Internal Medicine

## 2024-04-18 VITALS — BP 118/67 | HR 76 | Temp 98.1°F | Resp 14 | Ht 64.0 in | Wt 169.0 lb

## 2024-04-18 DIAGNOSIS — R195 Other fecal abnormalities: Secondary | ICD-10-CM

## 2024-04-18 DIAGNOSIS — K5730 Diverticulosis of large intestine without perforation or abscess without bleeding: Secondary | ICD-10-CM | POA: Diagnosis not present

## 2024-04-18 MED ORDER — SODIUM CHLORIDE 0.9 % IV SOLN: 500.0000 mL | Freq: Once | INTRAVENOUS | Status: DC

## 2024-04-18 NOTE — Progress Notes (Signed)
 History and Physical Interval Note:  04/18/2024 3:07 PM  Brandi Moon  has presented today for endoscopic procedure(s), with the diagnosis of  Encounter Diagnosis  Name Primary?   Heme + stool Yes  .  The various methods of evaluation and treatment have been discussed with the patient and/or family. After consideration of risks, benefits and other options for treatment, the patient has consented to  the endoscopic procedure(s).   The patient's history has been reviewed, patient examined, no change in status, stable for endoscopic procedure(s).  I have reviewed the patient's chart and labs.  Questions were answered to the patient's satisfaction.     [name], MD, [facility]

## 2024-04-18 NOTE — Progress Notes (Signed)
 Pt's states no medical or surgical changes since previsit or office visit.

## 2024-04-18 NOTE — Progress Notes (Signed)
 Sedate, gd SR, tolerated procedure well, VSS, report to RN

## 2024-04-18 NOTE — Op Note (Signed)
 [facility] Patient Name: Brandi Moon Procedure Date: 04/18/2024 2:58 PM MRN: [id number] Endoscopist: Lupita FORBES Commander , MD, [id number] Age: 78 Referring MD:  Date of Birth: August 31, 1945 Gender: Female Account #: [id number] Procedure:                Colonoscopy Indications:              Heme positive stool Medicines:                Monitored Anesthesia Care Procedure:                Pre-Anesthesia Assessment:                           - Prior to the procedure, a History and Physical                            was performed, and patient medications and                            allergies were reviewed. The patient's tolerance of                            previous anesthesia was also reviewed. The risks                            and benefits of the procedure and the sedation                            options and risks were discussed with the patient.                            All questions were answered, and informed consent                            was obtained. Prior Anticoagulants: The patient has                            taken no anticoagulant or antiplatelet agents. ASA                            Grade Assessment: II - A patient with mild systemic                            disease. After reviewing the risks and benefits,                            the patient was deemed in satisfactory condition to                            undergo the procedure.                           After obtaining informed consent, the colonoscope  was passed under direct vision. Throughout the                            procedure, the patient's blood pressure, pulse, and                            oxygen saturations were monitored continuously. The                            PCF-HQ190L Colonoscope [id number] was introduced                            through the anus and advanced to the the cecum,                            identified by appendiceal orifice and  ileocecal                            valve. The colonoscopy was performed without                            difficulty. The patient tolerated the procedure                            well. The quality of the bowel preparation was                            good. The ileocecal valve, appendiceal orifice, and                            rectum were photographed. Scope In: 3:16:08 PM Scope Out: 3:27:01 PM Scope Withdrawal Time: 0 hours 7 minutes 42 seconds  Total Procedure Duration: 0 hours 10 minutes 53 seconds  Findings:                 The perianal and digital rectal examinations were                            normal.                           Multiple diverticula were found in the sigmoid                            colon.                           The exam was otherwise without abnormality on                            direct and retroflexion views. Complications:            No immediate complications. Estimated Blood Loss:     Estimated blood loss: none. Impression:               - Diverticulosis in the sigmoid colon.                           -  The examination was otherwise normal on direct                            and retroflexion views.                           - No specimens collected. Recommendation:           - Patient has a contact number available for                            emergencies. The signs and symptoms of potential                            delayed complications were discussed with the                            patient. Return to normal activities tomorrow.                            Written discharge instructions were provided to the                            patient.                           - Resume previous diet.                           - Continue present medications.                           - No repeat colonoscopy due to age and the absence                            of colonic polyps. Lupita FORBES Commander, MD 04/18/2024 3:32:58 PM This report has  been signed electronically.

## 2024-04-18 NOTE — Patient Instructions (Addendum)
 Observe diverticulosis. Todo lo demas ere normal.  Brandi CHARLENA Commander, MD, FACG  USTED TUVO UN PROCEDIMIENTO ENDOSCPICO HOY EN [facility]:   Lea el informe del procedimiento que se le entreg para cualquier pregunta especfica sobre lo que se encontr durante su examen.  Si el informe del examen no responde a sus preguntas, por favor llame a su gastroenterlogo para aclararlo.  Si usted solicit que no se le den lowe's companies de lo que se clinical cytogeneticist en su procedimiento al marathon oil va a cuidar, entonces el informe del procedimiento se ha incluido en un sobre sellado para que usted lo revise despus cuando le sea ms conveniente.   LO QUE PUEDE ESPERAR: Algunas sensaciones de hinchazn en el abdomen.  Puede tener ms gases de lo normal.  El caminar puede ayudarle a eliminar el aire que se le puso en el tracto gastrointestinal durante el procedimiento y reducir la hinchazn.  Si le hicieron una endoscopia inferior (como una colonoscopia o una sigmoidoscopia flexible), podra notar manchas de sangre en las heces fecales o en el papel higinico.  Si se someti a una preparacin intestinal para su procedimiento, es posible que no tenga una evacuacin intestinal normal durante time warner.   Tenga en cuenta:  Es posible que note un poco de irritacin y congestin en la nariz o algn drenaje.  Esto es debido al oxgeno applied materials durante su procedimiento.  No hay que preocuparse y esto debe desaparecer ms o regulatory affairs officer.   SNTOMAS PARA REPORTAR INMEDIATAMENTE:  Despus de una endoscopia inferior (colonoscopia o sigmoidoscopia flexible):  Cantidades excesivas de sangre en las heces fecales  Sensibilidad significativa o empeoramiento de los dolores abdominales   Hinchazn aguda del abdomen que antes no tena   Fiebre de 100F o ms   Para asuntos urgentes o de associate professor, puede comunicarse con un gastroenterlogo a cualquier hora llamando al [phone].  DIETA:  Recomendamos una  comida pequea al principio, pero luego puede continuar con su dieta normal.  Tome muchos lquidos, pero debe evitar las bebidas alcohlicas durante 24 horas.    ACTIVIDAD:  Debe planear tomarse las cosas con calma por el resto del da y no debe CONDUCIR ni usar maquinaria pesada patent examiner (debido a los medicamentos de sedacin utilizados durante el examen).     SEGUIMIENTO: Nuestro personal llamar al nmero que aparece en su historial al siguiente da hbil de su procedimiento para ver cmo se siente y para responder cualquier pregunta o inquietud que pueda tener con respecto a la informacin que se le dio despus del procedimiento. Si no podemos contactarle, le dejaremos un mensaje.  Sin embargo, si se siente bien y no tiene english as a second language teacher, no es necesario que nos devuelva la llamada.  Asumiremos que ha regresado a sus actividades diarias normales sin incidentes. Si se le tomaron algunas biopsias, le contactaremos por telfono o por carta en las prximas 3 semanas.  Si no ha sabido [employer] biopsias en el transcurso de 3 semanas, por favor llmenos al [phone].   FIRMAS/CONFIDENCIALIDAD: Usted y/o el acompaante que le cuide han firmado documentos que se ingresarn en su historial mdico electrnico.  Estas firmas atestiguan el hecho de que la informacin anterior

## 2024-04-19 ENCOUNTER — Telehealth: Payer: Self-pay

## 2024-04-19 NOTE — Telephone Encounter (Signed)
 Attempted to reach patient via interpreter for post-procedure f/u call. No answer. Left message via interpreter for patient not to hesitate to call if she has questions/concerns regarding her care.

## 2024-05-29 ENCOUNTER — Ambulatory Visit: Admitting: Urology

## 2024-05-29 ENCOUNTER — Other Ambulatory Visit (HOSPITAL_BASED_OUTPATIENT_CLINIC_OR_DEPARTMENT_OTHER): Payer: Self-pay

## 2024-05-29 ENCOUNTER — Encounter: Payer: Self-pay | Admitting: Urology

## 2024-05-29 VITALS — BP 142/70 | HR 72

## 2024-05-29 DIAGNOSIS — Z87440 Personal history of urinary (tract) infections: Secondary | ICD-10-CM

## 2024-05-29 DIAGNOSIS — R3129 Other microscopic hematuria: Secondary | ICD-10-CM | POA: Diagnosis not present

## 2024-05-29 DIAGNOSIS — N390 Urinary tract infection, site not specified: Secondary | ICD-10-CM

## 2024-05-29 DIAGNOSIS — N3281 Overactive bladder: Secondary | ICD-10-CM | POA: Diagnosis not present

## 2024-05-29 LAB — URINALYSIS, ROUTINE W REFLEX MICROSCOPIC
Bilirubin, UA: NEGATIVE
Glucose, UA: NEGATIVE
Ketones, UA: NEGATIVE
Nitrite, UA: NEGATIVE
Specific Gravity, UA: 1.015 (ref 1.005–1.030)
Urobilinogen, Ur: 0.2 mg/dL (ref 0.2–1.0)
pH, UA: 6.5 (ref 5.0–7.5)

## 2024-05-29 LAB — MICROSCOPIC EXAMINATION

## 2024-05-29 MED ORDER — GEMTESA 75 MG PO TABS: 75.0000 mg | ORAL_TABLET | Freq: Every day | ORAL | 11 refills | Status: DC

## 2024-05-29 MED ORDER — GEMTESA 75 MG PO TABS
75.0000 mg | ORAL_TABLET | Freq: Every day | ORAL | 11 refills | Status: AC
  Filled 2024-05-29: qty 30, 30d supply, fill #0

## 2024-05-29 NOTE — Progress Notes (Signed)
 "  Assessment: 1. Recurrent UTI   2. OAB (overactive bladder)   3. Microscopic hematuria - negative urologic evaluation; likley nephrologic etiology     Plan: Continue Gemtesa  75 mg daily.    She has experienced side effects with Vesicare  and lack of response to trospium , will request insurance approval for this medication. Continue UTI prevention with vaginal estrogen cream and probiotic. Return to office in 3 months  Chief Complaint: Chief Complaint  Patient presents with   Recurrent UTI    HPI: Brandi Moon is a 78 y.o. female who presents for continued evaluation of recurrent UTI and OAB. She was previously followed by Dr. Shona and was last seen in February 2025.  She has a history of recurrent UTIs and was initially seen in June 2024.  She was started on daily antibiotic prophylaxis with single strength Bactrim  and vaginal hormone cream at that time. She continued on vaginal hormone cream and a probiotic.  She has not had any breakthrough UTIs.  She underwent evaluation for microscopic hematuria in July 2024. CT showed no renal or ureteral calculi, no renal masses and no evidence of obstruction or filling defect.  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 Cystoscopy was not performed.  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 Hematuria profile from 10/24 was negative for malignant cells or dysplasia.  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 Cystoscopy from 10/24 showed a normal urethra and bladder without any mucosal lesions. She was referred to nephrology but had not followed up with the referral at the time of her visit in February 2025. She was seen by nephrology on 11/15/2023.  She has a history of overactive bladder symptoms with associated incontinence.  She was  restarted on Vesicare  10 mg daily in February 2025.  At her visit in June 2025, she continued to have symptoms of frequency, urgency, and incontinence.  She was also having problems with constipation since she started the Vesicare .  No dysuria or gross hematuria.  No recent UTIs. The Vesicare  was discontinued and she was started on Myrbetriq  50 mg daily. She was unable to take the medication due to insurance coverage.  At her visit in September 2025, she continued to have frequency, urgency, urge incontinence.  She reported some dysuria which had resolved.  No gross hematuria or flank pain. She was given a trial of trospium  20 mg twice daily.  She was admitted to the hospital on 02/23/2024 with fatigue and malaise.  She was found to have a hemoglobin of 4.8.  Urine culture grew 30K E. coli.  Treated with Macrobid .   CT imaging consistent with acute cystitis with ascending UTI, no hydronephrosis or nephrolithiasis.  At her visit in October 2025, she had completed the Macrobid .  No dysuria or gross hematuria.  She continued on trospium  20 mg daily.  She did not feel like the medication was controlling her symptoms well. She was given a trial of Gemtesa  75 mg daily.  She returns today for follow-up.  She has not had any recent UTI symptoms.  No dysuria or gross hematuria.  She noted significant improvement in her OAB symptoms with Gemtesa .  No side effects.  She has run out of the samples.  She continues to use Estrace  vaginal cream and a daily probiotic for UTI prevention.  Interpreter was used during visit.  Portions of the above documentation were copied from a prior visit for review purposes only.  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 Pylori stool antigen - dx H. Pylori gastritis        Hyperlipidemia    Hypertension    Leg cramping     PSH: Past Surgical History:  Procedure Laterality Date   ABDOMINAL HYSTERECTOMY     APPENDECTOMY     APPENDECTOMY  1962   histerectomy     TUBAL LIGATION  1976    SH: Social History   Tobacco Use   Smoking status: Never   Smokeless tobacco: Never  Vaping Use   Vaping status: Never Used  Substance Use Topics   Alcohol use: No   Drug use: No    ROS: Constitutional:  Negative for fever, chills, weight loss CV: Negative for chest pain, previous MI, hypertension Respiratory:  Negative for shortness of breath, wheezing, sleep apnea, frequent cough GI:  Negative for nausea, vomiting, bloody stool, GERD  PE: BP (!) 142/70   Pulse 72  GENERAL APPEARANCE:  Well appearing, well developed, well nourished, NAD HEENT:  Atraumatic, normocephalic, oropharynx clear NECK:  Supple without lymphadenopathy or thyromegaly ABDOMEN:  Soft, non-tender, no masses EXTREMITIES:  Moves all extremities well, without clubbing, cyanosis, or edema NEUROLOGIC:  Alert and oriented x 3, normal gait, CN II-XII grossly intact MENTAL STATUS:  appropriate BACK:  Non-tender to palpation, No CVAT SKIN:  Warm, dry, and intact  Results: U/A: 11-30 WBCs, 11-30 RBCs, few bacteria "

## 2024-05-29 NOTE — Addendum Note (Signed)
 Addended by: ROSEANN ADINE PARAS on: 05/29/2024 09:27 AM   Modules accepted: Orders

## 2024-06-12 ENCOUNTER — Other Ambulatory Visit (HOSPITAL_BASED_OUTPATIENT_CLINIC_OR_DEPARTMENT_OTHER): Payer: Self-pay

## 2024-08-08 ENCOUNTER — Ambulatory Visit

## 2024-08-08 ENCOUNTER — Ambulatory Visit: Admitting: Medical

## 2024-08-28 ENCOUNTER — Ambulatory Visit: Admitting: Urology
